# Patient Record
Sex: Female | Born: 1940 | Race: White | Hispanic: No | Marital: Married | Smoking: Never smoker
Health system: Southern US, Community
[De-identification: ages and names within clinical notes are randomized; demographics above are authoritative.]

---

## 2006-08-12 ENCOUNTER — Ambulatory Visit: Payer: Self-pay | Admitting: Family Medicine

## 2006-08-12 DIAGNOSIS — N951 Menopausal and female climacteric states: Secondary | ICD-10-CM

## 2006-08-12 DIAGNOSIS — N63 Unspecified lump in unspecified breast: Secondary | ICD-10-CM

## 2006-08-21 ENCOUNTER — Encounter: Payer: Self-pay | Admitting: Family Medicine

## 2006-08-21 LAB — CONVERTED CEMR LAB
ALT: 15 units/L (ref 0–35)
AST: 16 units/L (ref 0–37)
BUN: 16 mg/dL (ref 6–23)
Calcium: 9 mg/dL (ref 8.4–10.5)
Chloride: 108 meq/L (ref 96–112)
Cholesterol: 206 mg/dL — ABNORMAL HIGH (ref 0–200)
Creatinine, Ser: 0.7 mg/dL (ref 0.40–1.20)
Glucose, Bld: 99 mg/dL (ref 70–99)
HDL: 47 mg/dL (ref >39)
LDL Cholesterol: 126 mg/dL — ABNORMAL HIGH (ref 0–99)
Potassium: 4.3 meq/L (ref 3.5–5.3)
Sodium: 142 meq/L (ref 135–145)
Total Bilirubin: 1.2 mg/dL (ref 0.3–1.2)
Total CHOL/HDL Ratio: 4.4
Triglycerides: 166 mg/dL — ABNORMAL HIGH (ref <150)

## 2006-08-22 ENCOUNTER — Encounter: Payer: Self-pay | Admitting: Family Medicine

## 2006-08-23 ENCOUNTER — Encounter: Payer: Self-pay | Admitting: Family Medicine

## 2006-08-26 ENCOUNTER — Ambulatory Visit: Payer: Self-pay | Admitting: Family Medicine

## 2006-08-26 ENCOUNTER — Other Ambulatory Visit: Admission: RE | Admit: 2006-08-26 | Discharge: 2006-08-26 | Payer: Self-pay | Admitting: Family Medicine

## 2006-08-26 ENCOUNTER — Encounter: Payer: Self-pay | Admitting: Family Medicine

## 2006-08-29 ENCOUNTER — Telehealth (INDEPENDENT_AMBULATORY_CARE_PROVIDER_SITE_OTHER): Payer: Self-pay | Admitting: *Deleted

## 2006-10-02 ENCOUNTER — Encounter: Admission: RE | Admit: 2006-10-02 | Discharge: 2006-10-02 | Payer: Self-pay | Admitting: Family Medicine

## 2006-10-02 ENCOUNTER — Ambulatory Visit: Payer: Self-pay | Admitting: Family Medicine

## 2006-10-03 ENCOUNTER — Encounter: Payer: Self-pay | Admitting: Family Medicine

## 2007-02-01 ENCOUNTER — Encounter: Payer: Self-pay | Admitting: Family Medicine

## 2007-02-03 ENCOUNTER — Encounter (INDEPENDENT_AMBULATORY_CARE_PROVIDER_SITE_OTHER): Payer: Self-pay | Admitting: *Deleted

## 2007-12-24 ENCOUNTER — Ambulatory Visit: Payer: Self-pay | Admitting: Family Medicine

## 2008-02-16 ENCOUNTER — Ambulatory Visit: Payer: Self-pay | Admitting: Family Medicine

## 2008-02-16 ENCOUNTER — Encounter: Admission: RE | Admit: 2008-02-16 | Discharge: 2008-02-16 | Payer: Self-pay | Admitting: Family Medicine

## 2008-02-16 DIAGNOSIS — M25559 Pain in unspecified hip: Secondary | ICD-10-CM | POA: Insufficient documentation

## 2008-02-18 ENCOUNTER — Encounter: Payer: Self-pay | Admitting: Family Medicine

## 2008-02-25 ENCOUNTER — Encounter: Payer: Self-pay | Admitting: Family Medicine

## 2008-03-04 ENCOUNTER — Encounter: Payer: Self-pay | Admitting: Family Medicine

## 2008-03-08 ENCOUNTER — Encounter: Payer: Self-pay | Admitting: Family Medicine

## 2008-03-16 ENCOUNTER — Encounter: Payer: Self-pay | Admitting: Family Medicine

## 2008-03-25 ENCOUNTER — Ambulatory Visit: Payer: Self-pay | Admitting: Family Medicine

## 2008-03-25 DIAGNOSIS — S72009A Fracture of unspecified part of neck of unspecified femur, initial encounter for closed fracture: Secondary | ICD-10-CM | POA: Insufficient documentation

## 2008-03-25 DIAGNOSIS — K922 Gastrointestinal hemorrhage, unspecified: Secondary | ICD-10-CM | POA: Insufficient documentation

## 2008-03-25 LAB — CONVERTED CEMR LAB
Glucose, Urine, Semiquant: NEGATIVE
Protein, U semiquant: >=300
Specific Gravity, Urine: >=1.03

## 2008-04-16 ENCOUNTER — Telehealth: Payer: Self-pay | Admitting: Family Medicine

## 2008-05-07 ENCOUNTER — Telehealth: Payer: Self-pay | Admitting: Family Medicine

## 2008-12-17 ENCOUNTER — Ambulatory Visit: Payer: Self-pay | Admitting: Oncology

## 2009-01-31 ENCOUNTER — Ambulatory Visit: Payer: Self-pay | Admitting: Family Medicine

## 2009-01-31 DIAGNOSIS — R209 Unspecified disturbances of skin sensation: Secondary | ICD-10-CM | POA: Insufficient documentation

## 2009-01-31 DIAGNOSIS — M79609 Pain in unspecified limb: Secondary | ICD-10-CM

## 2009-02-01 ENCOUNTER — Encounter: Admission: RE | Admit: 2009-02-01 | Discharge: 2009-02-01 | Payer: Self-pay | Admitting: Family Medicine

## 2009-02-02 ENCOUNTER — Encounter: Payer: Self-pay | Admitting: Family Medicine

## 2009-02-02 LAB — CONVERTED CEMR LAB
ALT: 28 units/L (ref 0–35)
BUN: 11 mg/dL (ref 6–23)
Basophils Absolute: 0 10*3/uL (ref 0.0–0.1)
Basophils Relative: 1 % (ref 0–1)
Chloride: 105 meq/L (ref 96–112)
Cholesterol: 153 mg/dL (ref 0–200)
Creatinine, Ser: 0.61 mg/dL (ref 0.40–1.20)
Folate: 16.8 ng/mL
HCT: 43.8 % (ref 36.0–46.0)
HDL: 51 mg/dL (ref >39)
Lymphs Abs: 1.7 10*3/uL (ref 0.7–4.0)
Platelets: 247 10*3/uL (ref 150–400)
Potassium: 5.4 meq/L — ABNORMAL HIGH (ref 3.5–5.3)
RBC: 4.77 M/uL (ref 3.87–5.11)
RDW: 13.9 % (ref 11.5–15.5)
TSH: 0.297 microintl units/mL — ABNORMAL LOW (ref 0.350–4.500)
Total Bilirubin: 0.8 mg/dL (ref 0.3–1.2)
Total Protein: 7.3 g/dL (ref 6.0–8.3)
Triglycerides: 89 mg/dL (ref <150)

## 2009-02-03 LAB — CONVERTED CEMR LAB
Free T4: 1.3 ng/dL (ref 0.80–1.80)
T3, Free: 3.3 pg/mL (ref 2.3–4.2)

## 2009-05-02 ENCOUNTER — Telehealth: Payer: Self-pay | Admitting: Family Medicine

## 2009-07-29 ENCOUNTER — Ambulatory Visit: Payer: Self-pay | Admitting: Family Medicine

## 2009-07-29 ENCOUNTER — Encounter: Admission: RE | Admit: 2009-07-29 | Discharge: 2009-07-29 | Payer: Self-pay | Admitting: Family Medicine

## 2009-08-04 ENCOUNTER — Encounter: Payer: Self-pay | Admitting: Family Medicine

## 2009-08-17 ENCOUNTER — Encounter: Payer: Self-pay | Admitting: Family Medicine

## 2009-09-07 ENCOUNTER — Ambulatory Visit: Payer: Self-pay | Admitting: Family Medicine

## 2009-09-07 LAB — CONVERTED CEMR LAB
Glucose, Urine, Semiquant: NEGATIVE
Ketones, urine, test strip: NEGATIVE
Nitrite: NEGATIVE
Protein, U semiquant: 30

## 2009-09-12 ENCOUNTER — Encounter: Payer: Self-pay | Admitting: Family Medicine

## 2009-09-13 ENCOUNTER — Ambulatory Visit: Payer: Self-pay | Admitting: Family Medicine

## 2009-09-13 DIAGNOSIS — R5383 Other fatigue: Secondary | ICD-10-CM

## 2009-09-13 DIAGNOSIS — M899 Disorder of bone, unspecified: Secondary | ICD-10-CM | POA: Insufficient documentation

## 2009-09-13 DIAGNOSIS — F3289 Other specified depressive episodes: Secondary | ICD-10-CM | POA: Insufficient documentation

## 2009-09-13 DIAGNOSIS — R5381 Other malaise: Secondary | ICD-10-CM

## 2009-09-13 DIAGNOSIS — F329 Major depressive disorder, single episode, unspecified: Secondary | ICD-10-CM

## 2009-09-13 DIAGNOSIS — M949 Disorder of cartilage, unspecified: Secondary | ICD-10-CM

## 2009-09-14 DIAGNOSIS — E0590 Thyrotoxicosis, unspecified without thyrotoxic crisis or storm: Secondary | ICD-10-CM | POA: Insufficient documentation

## 2009-09-14 LAB — CONVERTED CEMR LAB
CO2: 22 meq/L (ref 19–32)
Chloride: 107 meq/L (ref 96–112)
Free T4: 1.86 ng/dL — ABNORMAL HIGH (ref 0.80–1.80)
Hemoglobin: 13.3 g/dL (ref 12.0–15.0)
Potassium: 3.7 meq/L (ref 3.5–5.3)
RBC: 4.44 M/uL (ref 3.87–5.11)
RDW: 15 % (ref 11.5–15.5)
Total Protein: 7 g/dL (ref 6.0–8.3)
Vit D, 25-Hydroxy: 33 ng/mL (ref 30–89)
WBC: 8.2 10*3/uL (ref 4.0–10.5)

## 2009-09-19 ENCOUNTER — Ambulatory Visit: Payer: Self-pay | Admitting: Family Medicine

## 2009-09-19 DIAGNOSIS — I82409 Acute embolism and thrombosis of unspecified deep veins of unspecified lower extremity: Secondary | ICD-10-CM | POA: Insufficient documentation

## 2009-09-19 DIAGNOSIS — R0989 Other specified symptoms and signs involving the circulatory and respiratory systems: Secondary | ICD-10-CM

## 2009-09-19 DIAGNOSIS — R609 Edema, unspecified: Secondary | ICD-10-CM

## 2009-09-19 DIAGNOSIS — R0609 Other forms of dyspnea: Secondary | ICD-10-CM

## 2009-09-19 LAB — CONVERTED CEMR LAB
ALT: 21 units/L (ref 0–35)
AST: 17 units/L (ref 0–37)
Alkaline Phosphatase: 126 units/L — ABNORMAL HIGH (ref 39–117)
BUN: 15 mg/dL (ref 6–23)
Basophils Relative: 0 % (ref 0–1)
Bilirubin Urine: NEGATIVE
Chloride: 106 meq/L (ref 96–112)
Creatinine, Ser: 0.63 mg/dL (ref 0.40–1.20)
Eosinophils Absolute: 0.1 10*3/uL (ref 0.0–0.7)
Glucose, Urine, Semiquant: NEGATIVE
HCT: 41.3 % (ref 36.0–46.0)
Hemoglobin: 13.6 g/dL (ref 12.0–15.0)
Ketones, urine, test strip: NEGATIVE
Lymphocytes Relative: 21 % (ref 12–46)
Lymphs Abs: 1.5 10*3/uL (ref 0.7–4.0)
MCHC: 32.9 g/dL (ref 30.0–36.0)
Monocytes Absolute: 0.4 10*3/uL (ref 0.1–1.0)
Neutro Abs: 4.9 10*3/uL (ref 1.7–7.7)
Neutrophils Relative %: 71 % (ref 43–77)
Potassium: 3.7 meq/L (ref 3.5–5.3)
RDW: 14.9 % (ref 11.5–15.5)
Specific Gravity, Urine: 1.02
Total Bilirubin: 0.8 mg/dL (ref 0.3–1.2)
WBC: 6.9 10*3/uL (ref 4.0–10.5)
pH: 6.5

## 2009-09-21 ENCOUNTER — Encounter: Payer: Self-pay | Admitting: Family Medicine

## 2009-09-22 ENCOUNTER — Telehealth (INDEPENDENT_AMBULATORY_CARE_PROVIDER_SITE_OTHER): Payer: Self-pay | Admitting: *Deleted

## 2009-09-23 ENCOUNTER — Encounter: Payer: Self-pay | Admitting: Family Medicine

## 2009-09-23 LAB — CONVERTED CEMR LAB
Prothrombin Time: 18.8 s
Prothrombin Time: 18.8 s — ABNORMAL HIGH (ref 11.6–15.2)

## 2009-09-28 ENCOUNTER — Encounter: Payer: Self-pay | Admitting: Family Medicine

## 2009-09-28 LAB — CONVERTED CEMR LAB: Prothrombin Time: 24.4 s

## 2009-10-03 ENCOUNTER — Ambulatory Visit: Payer: Self-pay | Admitting: Family Medicine

## 2009-10-04 LAB — CONVERTED CEMR LAB
BUN: 15 mg/dL (ref 6–23)
Calcium: 9.8 mg/dL (ref 8.4–10.5)
Glucose, Bld: 99 mg/dL (ref 70–99)
Potassium: 3.9 meq/L (ref 3.5–5.3)

## 2009-10-26 ENCOUNTER — Ambulatory Visit: Payer: Self-pay | Admitting: Family Medicine

## 2009-10-26 LAB — CONVERTED CEMR LAB: INR: 1.6

## 2009-12-02 ENCOUNTER — Ambulatory Visit: Payer: Self-pay | Admitting: Family Medicine

## 2009-12-16 ENCOUNTER — Ambulatory Visit: Payer: Self-pay | Admitting: Family Medicine

## 2009-12-16 DIAGNOSIS — L84 Corns and callosities: Secondary | ICD-10-CM

## 2009-12-22 ENCOUNTER — Encounter: Payer: Self-pay | Admitting: Family Medicine

## 2009-12-23 ENCOUNTER — Ambulatory Visit: Payer: Self-pay | Admitting: Family Medicine

## 2009-12-23 LAB — CONVERTED CEMR LAB: INR: 2

## 2010-01-20 ENCOUNTER — Ambulatory Visit: Payer: Self-pay | Admitting: Family Medicine

## 2010-02-24 ENCOUNTER — Ambulatory Visit
Admission: RE | Admit: 2010-02-24 | Discharge: 2010-02-24 | Payer: Self-pay | Source: Home / Self Care | Attending: Family Medicine | Admitting: Family Medicine

## 2010-02-24 LAB — CONVERTED CEMR LAB: INR: 2.2

## 2010-03-05 ENCOUNTER — Encounter: Payer: Self-pay | Admitting: Family Medicine

## 2010-03-05 ENCOUNTER — Encounter: Payer: Self-pay | Admitting: Sports Medicine

## 2010-03-15 NOTE — Letter (Signed)
Summary: [facility]  [facility]   Imported By: Lanelle Bal 09/22/2009 12:37:38  _____________________________________________________________________  External Attachment:    Type:   Image     Comment:   External Document

## 2010-03-15 NOTE — Assessment & Plan Note (Signed)
Summary: weakness   Vital Signs:  Patient profile:   70 year old female Height:      62 inches Weight:      151 pounds BMI:     27.72 O2 Sat:      99 % on Room air Pulse rate:   89 / minute BP sitting:   147 / 78  (left arm) Cuff size:   regular  Vitals Entered By: Payton Spark CMA (December 16, 2009 1:19 PM)  O2 Flow:  Room air CC: Anxious, shakey, and feet burning/hot.   Primary Care Provider:  Seymour Bars D.O.  CC:  Anxious, shakey, and and feet burning/hot..  History of Present Illness: 70 yo WF presents for problems feeling shakey with anxiety and 6 mos of her feet burning with pain over the plantar surface of the R foot where calluses are and feeling a little lightheaded x 4 days but no true vertigo.  On Coumadin for DVT thru Jan 1st.  She has not had bruising, bleeding problems.  Denies diarrhea or vomitting.  Does not drink much in the way of fluids.  Denies urinary complaints or abd pain.  She had had weakness in her legs for a while and we set her up to see neuro for this in the summer but she did not keep her appt.  She has not had L spine MRI or brain MRI b/c she has metal in her hip and was afraid to do anything.  She took herself off Citalopram earlier this year simply b/c she didn't want to take it.  She has also been on neurontin and a TCA but had poor compliance.  Anticoagulation Management History:      She is being anticoagulated because of the first episode of deep venous thrombosis and/or pulmonary embolism.  Anticipated length of treatment is 3-6 months.  Her last INR was 1.8.    Current Medications (verified): 1)  Viactiv   Chew (Calcium-Vitamin D-Vitamin K Chew) .... Take 1 Tablet By Mouth Once A Day 2)  Tylenol Extra Strength 500 Mg Tabs (Acetaminophen) .... 2 Tabs By Mouth Three Times A Day As Needed Pain 3)  Omeprazole 40 Mg Cpdr (Omeprazole) .Marland Kitchen.. 1 Tab By Mouth Daily, Take 30 Min Before Breakfast 4)  Vesicare 5 Mg Tabs (Solifenacin Succinate) 5)   Coumadin 5 Mg Tabs (Warfarin Sodium) .... Sunday - 5 Mg, Monday - 5 Mg, Tuesday - 5 Mg, Wednesday - 5 Mg, Thursday - 5 Mg, Friday - 2.5 Mg, Saturday - 5 Mg  Allergies (verified): 1)  Vicodin  Past History:  Past Medical History: Reviewed history from 10/03/2009 and no changes required. G4P4 menopausal since age 89. 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 cardiac cath 1998- normal pinning of L hip fracture 02-2008  Family History: Reviewed history from 08/12/2006 and no changes required. mother colon cancer, died at 31 father died at 35, AMI in 73's, valvular dz sister alive and healthy  Social History: Reviewed history from 08/12/2006 and no changes required. Married to [place], retired.  Moved from [place] in 2004. 3 kids in [place], 1 in [place]. 3 grandkids.  Never smoked.  Denies ETOH. Gardens and walks 3 x a wk.  Fair diet.    Review of Systems      See HPI  Physical Exam  General:  alert, well-developed, well-nourished, well-hydrated, and overweight-appearing.  here with husband Head:  normocephalic and atraumatic.   Eyes:  sclera non icteric Nose:  no nasal discharge.   Mouth:  pharynx pink and moist.   Neck:  no masses.   Lungs:  Normal respiratory effort, chest expands symmetrically. Lungs are clear to auscultation, no crackles or wheezes. Heart:  Normal rate and regular rhythm. S1 and S2 normal without gallop, murmur, click, rub or other extra sounds. Pulses:  2+ pedal pulses Extremities:  trace pedal edema bilat with telangiecstasias over both ankles.  hammertoes.   Neurologic:  amulating with cane Skin:  color normal.  no pallor calluses - plantar surface R foot vs plantar warts and a callus over the L great toe Cervical Nodes:  No lymphadenopathy noted Psych:  depressed affect.     Impression & Recommendations:  Problem # 1:   CALLUS, FOOT (ICD-700) Assessment New Either corns or plantar warts, tender under the R foot. Send to Dr Yates Decamp for eval and tx. Orders: Podiatry Referral (Podiatry)  Problem # 2:  WEAKNESS (ICD-780.79) Ongoing problem with continued complaints along with this ? tremor and ? neuropathy. She failed to keep her neuro appt in August.  She is to schedule her own appt this time and I explained to her husband that she likely has lumbar stenosis or neuropathy and needs to see the neurologist.    Complete Medication List: 1)  Viactiv Chew (Calcium-vitamin d-vitamin k chew) .... Take 1 tablet by mouth once a day 2)  Tylenol Extra Strength 500 Mg Tabs (Acetaminophen) .... 2 tabs by mouth three times a day as needed pain 3)  Omeprazole 40 Mg Cpdr (Omeprazole) .Marland Kitchen.. 1 tab by mouth daily, take 30 min before breakfast 4)  Vesicare 5 Mg Tabs (Solifenacin succinate) 5)  Coumadin 5 Mg Tabs (Warfarin sodium) .... Sunday - 5 mg, monday - 5 mg, tuesday - 5 mg, wednesday - 5 mg, thursday - 5 mg, friday - 2.5 mg, saturday - 5 mg 6)  Citalopram Hydrobromide 20 Mg Tabs (Citalopram hydrobromide) .... 1/2 tab by mouth daily x 1 wk then increase to 1 tab by mouth daily 7)  Coumadin 5 Mg Tabs (Warfarin sodium) .... 1 tab by mouth daily  Patient Instructions: 1)  Restart Citalopram for anxiety.  Take this everyday. 2)  Will get you in with Dr Sprinkle for your feet and Dr Pinyon for leg weakness, ? neuropathy and tremor. 3)  REturn for f/u in 2 mos. Prescriptions: COUMADIN 5 MG TABS (WARFARIN SODIUM) 1 tab by mouth daily  #30 x 1   Entered and Authorized by:   Karen Bowen DO   Signed by:   Karen Bowen DO on 12/16/2009   Method used:   [facility]  [address] [id number]       [address]       [place], [place]  [address]       Ph: [phone] or [phone]       Fax: [phone]   RxID:   [id number] CITALOPRAM HYDROBROMIDE 20 MG TABS (CITALOPRAM HYDROBROMIDE) 1/2 tab by mouth  daily x 1 wk then increase to 1 tab by mouth daily  #30 x 2   Entered and Authorized by:   Seymour Bars DO   Signed by:   Seymour Bars DO on 12/16/2009   Method used:   [facility]        [facility] [phone][facility] (retail)  [address]       [place], [place]  [address]       Ph: [phone] or [phone]       Fax: [phone]   RxID:   [id number]    Orders Added: 1)  Podiatry Referral [Podiatry] 2)  Est. Patient Level IV [[address]253]

## 2010-03-15 NOTE — Assessment & Plan Note (Signed)
Summary: coumdin check - jr  Nurse Visit   Vitals Entered By: Payton Spark CMA (December 23, 2009 1:27 PM)  Allergies: 1)  Vicodin Laboratory Results   Blood Tests      INR: 2.0   (Normal Range: 0.88-1.12   Therap INR: 2.0-3.5)    Orders Added: 1)  Fingerstick [36416] 2)  Protime [[address]UE]   Anticoagulation Management History:      The patient is on coumadin and comes in today for a routine follow up visit.  Coumadin therapy is being given due to the first episode of deep venous thrombosis and/or pulmonary embolism.  Anticipated length of treatment is 3-6 months.  Her last INR was 1.8 and today's INR is 2.0.    Anticoagulation Management Assessment/Plan:      The target INR is 2.0-3.0.  She is to have a PT/INR in 4 weeks.  Anticoagulation instructions were given to patient.         Current Anticoagulation Instructions: The patient is to continue with the same dose of coumadin.  This dosage includes:  Coumadin 5 mg tabs and Coumadin 5 mg tabs:  Sunday - 5 mg, Monday - 5 mg, Tuesday - 5 mg, Wednesday - 5 mg, Thursday - 5 mg, Friday - 2.5 mg, Saturday - 5 mg.    Repeat PT/INR in 4 weeks.

## 2010-03-15 NOTE — Assessment & Plan Note (Signed)
Summary: UA  Nurse Visit   Vitals Entered By: Payton Spark CMA (September 07, 2009 2:34 PM)  Allergies: 1)  Vicodin Laboratory Results   Urine Tests    Routine Urinalysis   Color: yellow Appearance: Clear Glucose: negative   (Normal Range: Negative) Bilirubin: small   (Normal Range: Negative) Ketone: negative   (Normal Range: Negative) Spec. Gravity: >=1.030   (Normal Range: 1.003-1.035) Blood: large   (Normal Range: Negative) pH: 5.0   (Normal Range: 5.0-8.0) Protein: 30   (Normal Range: Negative) Urobilinogen: 0.2   (Normal Range: 0-1) Nitrite: negative   (Normal Range: Negative) Leukocyte Esterace: negative   (Normal Range: Negative)    Comments: Pt states she noticed blood in urine on Monday. She has started w/ nausea and dysuria today.     Orders Added: 1)  UA Dipstick w/o Micro (automated)  [81003] 2)  Est. Patient Level I [04540] Prescriptions: ROLLING WALKER Use as directed  #1 x 0   Entered and Authorized by:   Seymour Bars DO   Signed by:   Seymour Bars DO on 09/07/2009   Method used:   Printed then faxed to ...       [facility]  [address] [phone]* (retail)       [address] [place], [place]  [address]       Ph: [phone] or [phone]       Fax: [phone]   RxID:   [id number] LEVAQUIN 250 MG TABS (LEVOFLOXACIN) 1 tab by mouth once a day x 3 days  #3 tabs x 0   Entered and Authorized by:   Seymour Bars DO   Signed by:   Seymour Bars DO on 09/07/2009   Method used:   [facility]        [facility] [phone]* (retail)       [address] [place], [place]  [address]       Ph: [phone] or [phone]       Fax: [phone]   RxID:   [id number]      Impression & Recommendations:  Problem # 1:  UTI (ICD-599.0) UA + for infection with symptoms.  Will treat with 3 days of Levaquin.  Call if symptoms not resolved by Friday. Her updated medication list for this problem includes:    Vesicare 5 Mg Tabs (Solifenacin  succinate) .Marland Kitchen... 1 tab by mouth daily    Levaquin 250 Mg Tabs (Levofloxacin) .Marland Kitchen... 1 tab by mouth once a day x 3 days  Orders: UA Dipstick w/o Micro (automated)  (81003)  Complete Medication List: 1)  Viactiv Chew (Calcium-vitamin d-vitamin k chew) .... Take 1 tablet by mouth once a day 2)  Vesicare 5 Mg Tabs (Solifenacin succinate) .Marland Kitchen.. 1 tab by mouth daily 3)  Prednisone (pak) 10 Mg Tabs (Prednisone) .... Take the 12 day pack as directed 4)  Cyclobenzaprine Hcl 10 Mg Tabs (Cyclobenzaprine hcl) .Marland Kitchen.. 1 by mouth 2 times daily as needed for back pain- will cause drowsiness 5)  Levaquin 250 Mg Tabs (Levofloxacin) .Marland Kitchen.. 1 tab by mouth once a day x 3 days 6)  Rolling Walker  .... Use as directed  Appended Document: UA    Patient Instructions: 1)  Take 3 days of Levaquin for UTI. 2)  I will talk to your orthopedist about your leg pain and Dr Margaretha Sheffield. 3)  RX for Rolling walker given. 4)  If UTI symptoms not  improved in 3 days, pls call.   Appended Document: UA Pt aware of the above

## 2010-03-15 NOTE — Assessment & Plan Note (Signed)
Summary: LEG PROBLEMS//VGJ   Vital Signs:  Patient profile:   70 year old female Height:      62 inches Pulse rate:   86 / minute BP sitting:   140 / 81  (left arm) Cuff size:   regular  Vitals Entered By: Payton Spark CMA (July 29, 2009 1:25 PM) CC: Low back pain and leg pain x 1 week. Getting worse.   History of Present Illness: 70 yo woman here today for back and leg pain.  sxs first started 1 week ago while doing her home exercise program (stretches)- felt a pulling pain.  pain has been progressive w/ yesterday and today being the worst.  will have 'a spasm' and then it goes away.  pt w/ hx of femur fx and metal plate.  pain is L sided, and radiating down into lower leg and foot.  pt c/o radiation of numbness and tingling rather than pain.  pain w/ rotation to the R, pain w/ standing.  pain improves w/ sitting.  has taken Aleve and Advil PM w/out relief.  has seen [place] [facility] in past- would prefer not to see them again.    Current Medications (verified): 1)  Viactiv   Chew (Calcium-Vitamin D-Vitamin K Chew) .... Take 1 Tablet By Mouth Once A Day 2)  Vesicare 5 Mg Tabs (Solifenacin Succinate) .Marland Kitchen.. 1 Tab By Mouth Daily  Allergies (verified): 1)  Vicodin  Past History:  Past Surgical History: Last updated: 03/25/2008 cardiac cath 1998- normal pinning of L hip fracture 02-2008  Review of Systems      See HPI  Physical Exam  General:  alert, well-developed, well-nourished, and well-hydrated.  obese WF.  sitting in wheelchair Msk:  no TTP over lumbar spine or L hip.  (-) SLR bilaterally.  good ROM of L hip. limited flexion and extension of back Pulses:  2+ pedal pulses Extremities:  +1 edema of L ankle Neurologic:  diminished patellar reflexes abnormal gait strength 4-5/5 bilaterally   Impression & Recommendations:  Problem # 1:  HIP PAIN, LEFT (ICD-719.45) Assessment Unchanged limited exam due to pt's fear of pain and unwillingness to participate.  given radicular sxs  will start steroid taper.  muscle relaxer as needed for muscle spasm.  no narcotics due to vicodin allergy.  no bowel or bladder incontinence at this time.  check xrays of hip to assess plate placement and eval for occult fx.  refer to ortho.  reviewed supportive care and red flags that should prompt return.  Pt expresses understanding and is in agreement w/ this plan. Her updated medication list for this problem includes:    Cyclobenzaprine Hcl 10 Mg Tabs (Cyclobenzaprine hcl) .Marland Kitchen... 1 by mouth 2 times daily as needed for back pain- will cause drowsiness  Orders: T-Hip Comp Left Min 2-views (73510TC) Orthopedic Referral (Ortho) Prescription Created Electronically [phone])  Complete Medication List: 1)  Viactiv Chew (Calcium-vitamin d-vitamin k chew) .... Take 1 tablet by mouth once a day 2)  Vesicare 5 Mg Tabs (Solifenacin succinate) .Marland Kitchen.. 1 tab by mouth daily 3)  Prednisone (pak) 10 Mg Tabs (Prednisone) .... Take the 12 day pack as directed 4)  Cyclobenzaprine Hcl 10 Mg Tabs (Cyclobenzaprine hcl) .Marland Kitchen.. 1 by mouth 2 times daily as needed for back pain- will cause drowsiness  Patient Instructions: 1)  We'll call you with your xray results 2)  Someone will call you with your ortho appt 3)  Alternate heat and ice for pain 4)  Start the Prednisone as  directed 5)  Use the cyclobenzaprine (muscle relaxer) at night- will cause drowsiness 6)  Call with any questions or concerns Prescriptions: VESICARE 5 MG TABS (SOLIFENACIN SUCCINATE) 1 tab by mouth daily  #90 x 1   Entered by:   Payton Spark CMA   Authorized by:   Seymour Bars DO   Signed by:   Payton Spark CMA on 07/29/2009   Method used:   Faxed to ...       [facility]* (mail-order)       [address]       [place], [place]  [address]       Ph: [phone]       Fax: [phone]   RxID:   [id number] CYCLOBENZAPRINE HCL 10 MG  TABS (CYCLOBENZAPRINE HCL) 1 by mouth 2 times daily as needed for back pain- will cause  drowsiness  #30 x 0   Entered and Authorized by:   Neena Rhymes MD   Signed by:   Neena Rhymes MD on 07/29/2009   Method used:   [facility]        [facility] [phone]ail[facility]       [address]       [place], [place]  [address]       Ph: [phone] or [phone]       Fax: [phone]   RxID:   [id number] PREDNISONE (PAK) 10 MG TABS (PREDNISONE) take the 12 day pack as directed  #1 pack x 0   Entered and Authorized by:   Neena Rhymes MD   Signed by:   Neena Rhymes MD on 07/29/2009   Method used:   [facility]        [facility] [phone][facility] (retail)       [address]       [place], [place]  [address]       Ph: [phone] or [phone]       Fax: [phone]   RxID:   [id number]

## 2010-03-15 NOTE — Progress Notes (Signed)
Summary: Mail order denied Detrol [place]  Phone Note Call from Patient   Caller: Patient Summary of Call: Pt LMOM stating mail order company will not cover Detrol [place]. Pt needs 90 day supply of enablex, vesicare or oxybutynin. Whichever you feel is most appropriate for Pt. Please advise. Initial call taken by: Payton Spark CMA,  May 02, 2009 2:09 PM    New/Updated Medications: VESICARE 5 MG TABS (SOLIFENACIN SUCCINATE) 1 tab by mouth daily Prescriptions: VESICARE 5 MG TABS (SOLIFENACIN SUCCINATE) 1 tab by mouth daily  #90 x 1   Entered and Authorized by:   Seymour Bars DO   Signed by:   Seymour Bars DO on 05/02/2009   Method used:   [facility]        [facility] [phone]* (retail)       [address]       [place], [place]  [address]       Ph: [phone] or [phone]       Fax: [phone]   RxID:   [id number]   Appended Document: Mail order denied Detrol [place] Faxed

## 2010-03-15 NOTE — Assessment & Plan Note (Signed)
Summary: 4 week coumadin check - jr  Nurse Visit   Vitals Entered By: Payton Spark CMA (January 20, 2010 1:25 PM)  Anticoagulation Management History:      The patient is on coumadin and comes in today for a routine follow up visit.  Coumadin therapy is being given due to the first episode of deep venous thrombosis and/or pulmonary embolism.  Anticipated length of treatment is 3-6 months.  Her last INR was 2.0 and today's INR is 2.5.     Anticoagulation Management Assessment/Plan:      The target INR is 2.0-3.0.  She is to have a PT/INR in 4 weeks.  Anticoagulation instructions were given to patient.         Current Anticoagulation Instructions: The patient is to continue with the same dose of coumadin.  This dosage includes:   Coumadin 5 mg tabs and Coumadin 5 mg tabs:  Sunday - 5 mg, Monday - 5 mg, Tuesday - 5 mg, Wednesday - 5 mg, Thursday - 5 mg, Friday - 2.5 mg, Saturday - 5 mg.    Repeat PT/INR in 4 weeks.     Allergies: 1)  Vicodin Laboratory Results   Blood Tests      INR: 2.5   (Normal Range: 0.88-1.12   Therap INR: 2.0-3.5)    Orders Added: 1)  Fingerstick [36416] 2)  Protime [[address]UE]   Anticoagulation Management Assessment/Plan:      The target INR is 2.0-3.0.  She is to have a PT/INR in 4 weeks.  Anticoagulation instructions were given to patient.         Current Anticoagulation Instructions: The patient is to continue with the same dose of coumadin.  This dosage includes:   Coumadin 5 mg tabs and Coumadin 5 mg tabs:  Sunday - 5 mg, Monday - 5 mg, Tuesday - 5 mg, Wednesday - 5 mg, Thursday - 5 mg, Friday - 2.5 mg, Saturday - 5 mg.    Repeat PT/INR in 4 weeks.

## 2010-03-15 NOTE — Assessment & Plan Note (Signed)
Summary: weakness   Vital Signs:  Patient profile:   70 year old female Height:      62 inches O2 Sat:      96 % on Room air Pulse rate:   97 / minute BP sitting:   126 / 79  (left arm) Cuff size:   regular  Vitals Entered By: Payton Spark CMA (September 13, 2009 3:03 PM)  O2 Flow:  Room air CC: F/U. Has multiple compliants about back, legs and bladder.   Primary Care Provider:  Seymour Bars D.O.  CC:  F/U. Has multiple compliants about back and legs and bladder.Marland Kitchen  History of Present Illness: 70 yo WF presents for many problems.  1.  She has a pin in her L hip, placed Jan 2010.  She had a slow recovery after that, graduating to a walker most of the time.  Without any known cause, she started having increased pain in the L hip in June.  She was seen here followed by a visit to Dr Margaretha Sheffield and Dr Elesa Massed (ortho).  It was felt that here hardware was stable.  She recieved a bursae injection which did not help.  She took herself off all of her pain meds.  She is in PT at [facility] and using a walker and a wheelchair now.    2.  She saw me back in Dec for bilat leg weakness and at that time we discussed a possible dx of Lumbar stenosis.  She has gotten worse as far as weakness with ambulating in both legs.  She denies much numbness or LBP.  She has had OAB symptoms and fatigue of her UEs.  Her husband denies that she has trouble speaking, mentating or remembering things.  She has been depressed since all of this has occured.     Current Medications (verified): 1)  Viactiv   Chew (Calcium-Vitamin D-Vitamin K Chew) .... Take 1 Tablet By Mouth Once A Day  Allergies (verified): 1)  Vicodin  Past History:  Past Medical History: Reviewed history from 03/25/2008 and no changes required. G4P4 menopausal since age 68. hx of melanoma hx of benign breast mass 2004 upper GI bleed 03-2008  Past Surgical History: Reviewed history from 03/25/2008 and no changes required. cardiac cath 1998-  normal pinning of L hip fracture 02-2008  Social History: Reviewed history from 08/12/2006 and no changes required. Married to [place], retired.  Moved from [place] in 2004. 3 kids in [place], 1 in [place]. 3 grandkids.  Never smoked.  Denies ETOH. Gardens and walks 3 x a wk.  Fair diet.    Review of Systems General:  Complains of fatigue, sleep disorder, and weakness; denies loss of appetite. CV:  Denies shortness of breath with exertion and swelling of feet. GU:  Complains of incontinence and urinary frequency. MS:  Complains of joint pain and muscle weakness; denies low back pain. Neuro:  Complains of numbness and poor balance; denies falling down, headaches, inability to speak, memory loss, tingling, and tremors. Psych:  Complains of easily tearful.  Physical Exam  General:  overwt WF here with husband, sitting in wheelchair Head:  normocephalic and atraumatic.   Eyes:  pupils equal, pupils round, and pupils reactive to light.   Mouth:  pharynx pink and moist.   Neck:  supple and full ROM.   Lungs:  Normal respiratory effort, chest expands symmetrically. Lungs are clear to auscultation, no crackles or wheezes. Heart:  Normal rate and regular rhythm. S1 and S2 normal  without gallop, murmur, click, rub or other extra sounds. Msk:  able to actively flex the R hip better than the L.  Able to extend both legs with strenghth R>Ll  full c spine and UE ROM.  L spine NTTP.  slow gait with weaknes on R>L foot dorsiflexion.   Extremities:  no LE edema Neurologic:  sensation intact to light touch.   Skin:  color normal.   Cervical Nodes:  No lymphadenopathy noted Psych:  memory intact for recent and remote, good eye contact, and tearful.     Impression & Recommendations:  Problem # 1:  HIP PAIN, LEFT (ZOX-096.04) Reviewed notes from Dr Elesa Massed and Dr Margaretha Sheffield.  It seems that her hip pain is only part of the problem.  She is doing PT.  Failed to improve after June's steroid injection.  She was  told that she may need to have plate and screws removed and will need to f/u with Dr Elesa Massed.  She has decided to avoid RX pain meds, so I have given her the proper amt of tylenol she can take.  Avoiding NSAIDs after having an upper GI bleed last year. The following medications were removed from the medication list:    Cyclobenzaprine Hcl 10 Mg Tabs (Cyclobenzaprine hcl) .Marland Kitchen... 1 by mouth 2 times daily as needed for back pain- will cause drowsiness Her updated medication list for this problem includes:    Tylenol Extra Strength 500 Mg Tabs (Acetaminophen) .Marland Kitchen... 2 tabs by mouth three times a day as needed pain  Problem # 2:  WEAKNESS (ICD-780.79) Unchanged from our visit in Dec.  She likely has lumbar stenosis but has metal in her hip, so unable to get an MRI.  She has significant weakness in her gait in both legs today and I am concerned that she may have a central neurologic process.  She and her husband deny problems with mentation  and she had a normal B12 and Folic Acid 6 mos ago.  She had some micrographia and slow gait.  Parkinsons Dz and MS are in the DDX.  If everything is normal, my next step would be a neurology referral.   Orders: T-CBC No Diff ([id number]) T-Comprehensive Metabolic Panel ([id number])  Problem # 3:  OSTEOPENIA (ICD-733.90) Will update her DEXA and Vit D level with thyroid function.  She does NOT need to see and endocrinologist for this. Her updated medication list for this problem includes:    Viactiv Chew (Calcium-vitamin d-vitamin k chew) .Marland Kitchen... Take 1 tablet by mouth once a day  Orders: T-DXA Bone Density/ Appendicular (86578) T-Dual DXA Bone Density/ Axial (46962) T-Vitamin D (25-Hydroxy) (95284-13244)  Problem # 4:  HYPERTHYROIDISM, SUBCLINICAL (ICD-242.90) Recheck labs today. Orders: T-TSH [id number]) T-T3, Free [id number]) T-T4, Free [phone])  Problem # 5:  DEPRESSION, MILD (ICD-311) PHQ-9 score of 6 (though she seems more tearful than this  during our history). A big part of this has been chronic pain and loss of function.  Will treat her nerve root pain and mood with amitriptyline at bedtime. RTC in 1 month. Her updated medication list for this problem includes:    Amitriptyline Hcl 25 Mg Tabs (Amitriptyline hcl) .Marland Kitchen... 1/2 tab by mouth qhs  Complete Medication List: 1)  Viactiv Chew (Calcium-vitamin d-vitamin k chew) .... Take 1 tablet by mouth once a day 2)  Amitriptyline Hcl 25 Mg Tabs (Amitriptyline hcl) .... 1/2 tab by mouth qhs 3)  Tylenol Extra Strength 500 Mg Tabs (Acetaminophen) .... 2 tabs by mouth three  times a day as needed pain  Patient Instructions: 1)  Labs today. 2)  Will call you w/ results tomorrow. 3)  Set up DEXA scan. 4)  Will contact ortho/ sports med re: L spine imaging. 5)  Return for f/u weakness in 1 month. Prescriptions: AMITRIPTYLINE HCL 25 MG TABS (AMITRIPTYLINE HCL) 1/2 tab by mouth qhs  #30 x 2   Entered and Authorized by:   Seymour Bars DO   Signed by:   Seymour Bars DO on 09/13/2009   Method used:   Electronically to        [facility] [phone][facility] (retail)       [address]       [place], [place]  [address]       Ph: [phone] or [phone]       Fax: [phone]   RxID:   [id number]

## 2010-03-15 NOTE — Consult Note (Signed)
Summary: [facility]  [facility]   Imported By: Lanelle Bal 08/17/2009 13:46:44  _____________________________________________________________________  External Attachment:    Type:   Image     Comment:   External Document

## 2010-03-15 NOTE — Progress Notes (Signed)
Summary: INR order for lab  Phone Note Call from Patient   Caller: Spouse Summary of Call: Pt has to have PT/INR done at the lab per insurance. I will send order and Pt will have drawn tomorrow  Initial call taken by: Payton Spark CMA,  September 22, 2009 1:29 PM  New Problems: COUMADIN THERAPY (ICD-V58.61) AC VENUS EMBO & THROMB UNSPEC DEEP VES LOWER EXT (ICD-453.40)   New Problems: COUMADIN THERAPY (ICD-V58.61) AC VENUS EMBO & THROMB UNSPEC DEEP VES LOWER EXT (ICD-453.40)

## 2010-03-15 NOTE — Assessment & Plan Note (Signed)
Summary: INR check & flu shot - jr  Nurse Visit  Flu Vaccine Consent Questions     Do you have a history of severe allergic reactions to this vaccine? no    Any prior history of allergic reactions to egg and/or gelatin? no    Do you have a sensitivity to the preservative Thimersol? no    Do you have a past history of Guillan-Barre Syndrome? no    Do you currently have an acute febrile illness? no    Have you ever had a severe reaction to latex? no    Vaccine information given and explained to patient? yes    Are you currently pregnant? no    Lot Number:[id number]   Exp Date:08/12/2010   Site Given  Left Deltoid IM   Allergies: 1)  Vicodin Laboratory Results   Blood Tests   Date/Time Received: 12/02/2009 Date/Time Reported: 12/02/2009   INR: 1.8   (Normal Range: 0.88-1.12   Therap INR: 2.0-3.5)    Orders Added: 1)  Fingerstick [36416] 2)  Protime [85610QW] 3)  Flu Vaccine 64yrs + MEDICARE PATIENTS [Q2039] 4)  Administration Flu vaccine - MCR [G0008]   Anticoagulation Management History:      The patient is on coumadin and comes in today for a routine follow up visit.  She is being anticoagulated because of the first episode of deep venous thrombosis and/or pulmonary embolism.  Anticipated length of treatment is 3-6 months.  Her last INR was 1.6 and today's INR is 1.8.    Anticoagulation Management Assessment/Plan:      The target INR is 2.0-3.0.  She is to have a PT/INR in 3 weeks.         Current Anticoagulation Instructions: The patient's dosage of coumadin will be increased.  The new dosage includes:   Coumadin 5 mg tabs:  Sunday - 5 mg, Monday - 5 mg, Tuesday - 5 mg, Wednesday - 5 mg, Thursday - 5 mg, Friday - 2.5 mg, Saturday - 5 mg.    Repeat PT/INR in 3 weeks.

## 2010-03-15 NOTE — Assessment & Plan Note (Signed)
Summary: leg edema   Vital Signs:  Patient profile:   70 year old female Height:      62 inches O2 Sat:      97 % on Room air Temp:     98.4 degrees F oral Pulse rate:   92 / minute BP sitting:   133 / 80  (left arm) Cuff size:   regular  Vitals Entered By: Payton Spark CMA (September 19, 2009 10:48 AM)  O2 Flow:  Room air CC: Feet swollen x 2 days.   Primary Care Provider:  Seymour Bars D.O.  CC:  Feet swollen x 2 days.Andrea Munoz  History of Present Illness: 70 yo WF presents for new onset bilateral foot swelling x 2 days.    She has had swelling before but it was with her hip fracture.  She has not had any recent trauma. She does not seem to be retaining fluid elsewhere.  Denies any SOB but has also been very sedentary.  Denies any PND or orthopnea.  She tried ice and elevation but the swelling did not improve.  She also has not had much improvment with early morning or with elevation.  Denies any changes to her meds or problems voiding.    I saw her recently for her leg weaknss and hip pain.     Current Medications (verified): 1)  Viactiv   Chew (Calcium-Vitamin D-Vitamin K Chew) .... Take 1 Tablet By Mouth Once A Day 2)  Amitriptyline Hcl 25 Mg Tabs (Amitriptyline Hcl) .... 1/2 Tab By Mouth Qhs 3)  Tylenol Extra Strength 500 Mg Tabs (Acetaminophen) .... 2 Tabs By Mouth Three Times A Day As Needed Pain 4)  Omeprazole 40 Mg Cpdr (Omeprazole) .Andrea Munoz.. 1 Tab By Mouth Daily, Take 30 Min Before Breakfast  Allergies (verified): 1)  Vicodin  Past History:  Past Medical History: Reviewed history from 03/25/2008 and no changes required. G4P4 menopausal since age 20. hx of melanoma hx of benign breast mass 2004 upper GI bleed 03-2008  Past Surgical History: Reviewed history from 03/25/2008 and no changes required. cardiac cath 1998- normal pinning of L hip fracture 02-2008  Social History: Reviewed history from 08/12/2006 and no changes required. Married to [place], retired.  Moved  from [place] in 2004. 3 kids in [place], 1 in [place]. 3 grandkids.  Never smoked.  Denies ETOH. [place] and walks 3 x a wk.  Fair diet.    Review of Systems General:  Complains of fatigue and weakness; denies chills, fever, malaise, sleep disorder, and weight loss. Eyes:  Denies blurring. CV:  Complains of swelling of feet; denies chest pain or discomfort and swelling of hands. GI:  Denies abdominal pain and bloody stools. MS:  leg/ foot pain. Neuro:  Complains of poor balance.  Physical Exam  General:  overwt WF here with husband, sitting in wheelchair Head:  normocephalic and atraumatic.   Eyes:  sclera non icteric Mouth:  pharynx pink and moist.   Neck:  no masses.   Lungs:  Normal respiratory effort, chest expands symmetrically. Lungs are clear to auscultation, no crackles or wheezes. Heart:  Normal rate and regular rhythm. S1 and S2 normal without gallop, murmur, click, rub or other extra sounds. Abdomen:  soft and non-tender.   Extremities:  bilateral 3+ pitting pedal edema with 1+ pitting edema all the way up to the knees.  No redness or heat.   Skin:  color normal.   Cervical Nodes:  No lymphadenopathy noted Inguinal Nodes:  No significant  adenopathy Psych:  flat affect.     Impression & Recommendations:  Problem # 1:  LEG EDEMA, BILATERAL (ICD-782.3) Assessment New UA neg for proteinuria.  Unable to weigh since she is so unsteady on her feet.  Will get labs today to look for cause of sudden bilat foot swelling and f/u results this afternoon.  R/O CHF, low serum protein, blood clots, etc.  Will have her elevate her legs at home and use Furosmide (assuming normal renal function) for her swelling.   Her updated medication list for this problem includes:    Furosemide 40 Mg Tabs (Furosemide) .Andrea Munoz... 1 tab by mouth daily for leg swelling  Orders: T-Comprehensive Metabolic Panel [phone]) T-BNP  (B Natriuretic Peptide) ([id number]) T-D-Dimer Fibrin Derivatives Quantitive  [phone]) T-CBC w/Diff ([id number]) T-LDH (10272-53664) UA Dipstick w/o Micro (automated)  (81003)  Problem # 2:  HYPERTHYROIDISM (ICD-242.90) Assessment: New This is new.  It may be a reason for her leg swelling and we do need to proceed with nuclear testing.  Problem # 3:  WEAKNESS (ICD-780.79) I watched her walk just last week and she has significant weakness in both of her legs.  More than she should have this far out from hip surgery suggesting a neurologic dx (either a lumbar stenosis or a central neurologic dz).  I talked to pt and her husband today about getting her into neurology and they are agreeable.   Orders: Neurology Referral (Neuro)  Problem # 4:  HIP PAIN, LEFT (QIH-474.25) She sees Dr Elesa Massed at [facility].  His suggestion was to remove the hardware in her hip if pain is not improving.  She will need to f/u with him for pain.   Her updated medication list for this problem includes:    Tylenol Extra Strength 500 Mg Tabs (Acetaminophen) .Andrea Munoz... 2 tabs by mouth three times a day as needed pain  Complete Medication List: 1)  Viactiv Chew (Calcium-vitamin d-vitamin k chew) .... Take 1 tablet by mouth once a day 2)  Amitriptyline Hcl 25 Mg Tabs (Amitriptyline hcl) .... 1/2 tab by mouth qhs 3)  Tylenol Extra Strength 500 Mg Tabs (Acetaminophen) .... 2 tabs by mouth three times a day as needed pain 4)  Omeprazole 40 Mg Cpdr (Omeprazole) .Andrea Munoz.. 1 tab by mouth daily, take 30 min before breakfast 5)  Furosemide 40 Mg Tabs (Furosemide) .Andrea Munoz.. 1 tab by mouth daily for leg swelling  Patient Instructions: 1)  Labs today. 2)  Will call you w/ results this afternoon. 3)  Will change Nuclear Thyroid test to [facility]. 4)  Will schedule you to see Dr Gaetano Net (neurologist) in [place] for weakness. 5)  Start on Furosemide 40 mg once daily for leg swelling.  Elevate legs.   6)  REturn in 1 wk to recheck legs.   Prescriptions: FUROSEMIDE 40 MG TABS (FUROSEMIDE) 1 tab by mouth daily for leg  swelling  #15 x 0   Entered and Authorized by:   Seymour Bars DO   Signed by:   Seymour Bars DO on 09/19/2009   Method used:   [facility]ron[facility] to        [facility] [phone]* (retail)       [address] [place], [place]  [address]       Ph: [phone] or [phone]       Fax: [phone]   RxID:   [id number]   Laboratory Results   Urine Tests    Routine Urinalysis  Color: yellow Appearance: Clear Glucose: negative   (Normal Range: Negative) Bilirubin: negative   (Normal Range: Negative) Ketone: negative   (Normal Range: Negative) Spec. Gravity: 1.020   (Normal Range: 1.003-1.035) Blood: trace-intact   (Normal Range: Negative) pH: 6.5   (Normal Range: 5.0-8.0) Protein: negative   (Normal Range: Negative) Urobilinogen: 0.2   (Normal Range: 0-1) Nitrite: negative   (Normal Range: Negative) Leukocyte Esterace: trace   (Normal Range: Negative)

## 2010-03-15 NOTE — Letter (Signed)
Summary: Depression Questionnaire  Depression Questionnaire   Imported By: Lanelle Bal 09/29/2009 12:01:16  _____________________________________________________________________  External Attachment:    Type:   Image     Comment:   External Document

## 2010-03-15 NOTE — Letter (Signed)
Summary: [facility]  [facility]   Imported By: Lanelle Bal 10/04/2009 10:22:22  _____________________________________________________________________  External Attachment:    Type:   Image     Comment:   External Document

## 2010-03-15 NOTE — Assessment & Plan Note (Signed)
Summary: leg edema   Vital Signs:  Patient profile:   70 year old female Height:      62 inches Weight:      150 pounds BMI:     27.53 O2 Sat:      95 % on Room air Pulse rate:   101 / minute BP sitting:   144 / 78  (left arm) Cuff size:   regular  Vitals Entered By: Payton Spark CMA (October 03, 2009 1:48 PM)  O2 Flow:  Room air CC: Leg swelling   Primary Care Provider:  Seymour Bars D.O.  CC:  Leg swelling.  History of Present Illness: 70 yo WF presents for HFU visit.  She was admitted to [facility] from 8/8 to 8/10 for an acute LLE DVT.  She has had chronic pain with increased LE edema over the past 2 wks.  She is off her Lasix, held by the hospital.  She had normal C and L spine xrays and an apparent neg w/u for hypercoagulable state.  She was fitted for compression hose but reports that they 'hurt' so she is not routinely wearing them.  She is ambulating w/ a walker and in PT but has missed a few sessions.  She also missed her initial appt with Dr Gaetano Net for leg weakness and refused to do an L spine MRI while in the hopsital.  As far as her clot, she denies SOB, chest pain.  She is overdue for a mammogram.  She still has her ovaries.  She is not a smoker and has a distant hx of melanoma.    Her husband is growing impatient and wants to bring her to the [facility] to 'figure things out'.      Anticoagulation Management History:      The patient is on coumadin and comes in today for a routine follow up visit.  She is being anticoagulated due to the first episode of deep venous thrombosis and/or pulmonary embolism.  Anticipated length of treatment is 3-6 months.  Her last INR was 2.24 and today's INR is 3.9.    Current Medications (verified): 1)  Viactiv   Chew (Calcium-Vitamin D-Vitamin K Chew) .... Take 1 Tablet By Mouth Once A Day 2)  Amitriptyline Hcl 25 Mg Tabs (Amitriptyline Hcl) .... 1/2 Tab By Mouth Qhs 3)  Tylenol Extra Strength 500 Mg Tabs  (Acetaminophen) .... 2 Tabs By Mouth Three Times A Day As Needed Pain 4)  Omeprazole 40 Mg Cpdr (Omeprazole) .Marland Kitchen.. 1 Tab By Mouth Daily, Take 30 Min Before Breakfast 5)  Furosemide 40 Mg Tabs (Furosemide) .Marland Kitchen.. 1 Tab By Mouth Daily For Leg Swelling 6)  Coumadin 5 Mg Tabs (Warfarin Sodium) .... Sunday - 5 Mg, Monday - 5 Mg, Tuesday - 5 Mg, Wednesday - 5 Mg, Thursday - 5 Mg, Friday - 5 Mg, Saturday - 5 Mg  Allergies (verified): 1)  Vicodin  Past History:  Past Medical History: G4P4 menopausal since age 69. 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 cardiac cath 1998- normal pinning of L hip fracture 02-2008  Social History: Reviewed history from 08/12/2006 and no changes required. Married to [place], retired.  Moved from [place] in 2004. 3 kids in [place], 1 in [place]. 3 grandkids.  Never smoked.  Denies ETOH. Gardens and walks 3 x a wk.  Fair diet.    Review of  Systems      See HPI  Physical Exam  General:  alert, well-developed, well-nourished, well-hydrated, and overweight-appearing.  ambulating with a walker Head:  normocephalic and atraumatic.   Eyes:  pupils equal, pupils round, and pupils reactive to light.   Mouth:  pharynx pink and moist.   Neck:  no masses.   Lungs:  Normal respiratory effort, chest expands symmetrically. Lungs are clear to auscultation, no crackles or wheezes. Heart:  Normal rate and regular rhythm. S1 and S2 normal without gallop, murmur, click, rub or other extra sounds. Msk:  no redness over joints.   Pulses:  2+ radial and pedal pulses Extremities:  L foot/ heel pitting edema - 2+ with venous stasis.  no calf pain with squeezing, trace edema on the R side Skin:  color normal.   Psych:  not anxious appearing and flat affect.     Impression & Recommendations:  Problem # 1:  DVT (ICD-453.40) Assessment New On coumadin x 2  wks now and off Lovenox.  Supratherapeutic.  Will cut back on her dose and recheck in 2 wks.  Still not sure why she had a DVT.  She needs to get updated on cancer screening.  Mammogram order printed today.  Consider getting a PET scan.    She is on Tylenol for pain and this has helped.  Has TED hose but does not think these are helping. Orders: Fingerstick (04540) Protime INR (98119) T-C-Reactive Protein ([id number])  Problem # 2:  LEG EDEMA, BILATERAL (ICD-782.3) L>R LE edema, from DVT.  Will recheck renail function.  She is not taking her Furosemide.  She is to do leg elevation and compression hose.  May need further eval for inguinal lymphadenopathy/ cancer screening. Her updated medication list for this problem includes:    Furosemide 40 Mg Tabs (Furosemide) .Marland Kitchen... 1 tab by mouth daily for leg swelling  Orders: T-Basic Metabolic Panel [id number]) T-C-Reactive Protein [phone])  Problem # 3:  WEAKNESS (ICD-780.79) This still could be from lumbar spinal stenosis but she refuses an MRI and has been somewhat non compliant with PT.  She is to f/u with Dr Gaetano Net for her weakness which may be central or peripheral in etiology.    Complete Medication List: 1)  Viactiv Chew (Calcium-vitamin d-vitamin k chew) .... Take 1 tablet by mouth once a day 2)  Amitriptyline Hcl 25 Mg Tabs (Amitriptyline hcl) .... 1/2 tab by mouth qhs 3)  Tylenol Extra Strength 500 Mg Tabs (Acetaminophen) .... 2 tabs by mouth three times a day as needed pain 4)  Omeprazole 40 Mg Cpdr (Omeprazole) .Marland Kitchen.. 1 tab by mouth daily, take 30 min before breakfast 5)  Furosemide 40 Mg Tabs (Furosemide) .Marland Kitchen.. 1 tab by mouth daily for leg swelling 6)  Coumadin 5 Mg Tabs (Warfarin sodium) .... Sunday - 5 mg, monday - 2.5 mg, tuesday - 5 mg, wednesday - 2.5 mg, thursday - 5 mg, friday - 2.5 mg, saturday - 5 mg  Other Orders: T-Mammography Bilateral Screening (44010)  Anticoagulation Management Assessment/Plan:      The target  INR is 2.0-3.0.  She is to have a PT/INR in 2 weeks.  Anticoagulation instructions were given to patient.         Current Anticoagulation Instructions: Hold today's coumadin dose.  Coumadin 5 mg tabs:  Sunday - 5 mg, Monday - 2.5 mg, Tuesday - 5 mg, Wednesday - 2.5 mg, Thursday - 5 mg, Friday - 2.5 mg, Saturday - 5 mg.    Repeat  PT/INR in 2 weeks.    Laboratory Results   Blood Tests      INR: 3.9   (Normal Range: 0.88-1.12   Therap INR: 2.0-3.5)

## 2010-03-15 NOTE — Assessment & Plan Note (Signed)
Summary: DVT f/u   Vital Signs:  Patient profile:   70 year old female Height:      62 inches Weight:      152 pounds BMI:     27.90 O2 Sat:      97 % on Room air Pulse rate:   86 / minute BP sitting:   139 / 73  (right arm) Cuff size:   regular  Vitals Entered By: Payton Spark CMA (October 26, 2009 3:13 PM)  O2 Flow:  Room air CC: F/U.    Primary Care Salvatrice Morandi:  Seymour Bars D.O.  CC:  F/U. Marland Kitchen  History of Present Illness: 70 yo WF presents for f/u LLE DVT.  This is her note from last time.   70 yo WF presents for HFU visit.  She was admitted to [facility] from 8/8 to 8/10 for an acute LLE DVT.  She has had chronic pain with increased LE edema over the past 2 wks.  She is off her Lasix, held by the hospital.  She had normal C and L spine xrays and an apparent neg w/u for hypercoagulable state.  She was fitted for compression hose but reports that they 'hurt' so she is not routinely wearing them.  She is ambulating w/ a walker and in PT but has missed a few sessions.  She also missed her initial appt with Dr Gaetano Net for leg weakness and refused to do an L spine MRI while in the hopsital.  As far as her clot, she denies SOB, chest pain.  She is overdue for a mammogram.  She still has her ovaries.  She is not a smoker and has a distant hx of melanoma.    Her husband is growing impatient and wants to bring her to the [facility] to 'figure things out'.    Since this visit, she is feeling much better.  She denies any pain and has had a drop in her leg swelling.  Doing PT and ambulating more freely w/ her walker.  She is happier.  Due for INR today.      Anticoagulation Management History:      The patient is on coumadin and comes in today for a routine follow up visit.  She is being anticoagulated due to the first episode of deep venous thrombosis and/or pulmonary embolism.  Anticipated length of treatment is 3-6 months.  Her last INR was 3.9 and today's INR is  1.6.    Current Medications (verified): 1)  Viactiv   Chew (Calcium-Vitamin D-Vitamin K Chew) .... Take 1 Tablet By Mouth Once A Day 2)  Tylenol Extra Strength 500 Mg Tabs (Acetaminophen) .... 2 Tabs By Mouth Three Times A Day As Needed Pain 3)  Omeprazole 40 Mg Cpdr (Omeprazole) .Marland Kitchen.. 1 Tab By Mouth Daily, Take 30 Min Before Breakfast 4)  Furosemide 40 Mg Tabs (Furosemide) .Marland Kitchen.. 1 Tab By Mouth Daily For Leg Swelling 5)  Coumadin 5 Mg Tabs (Warfarin Sodium) .... Sunday - 5 Mg, Monday - 2.5 Mg, Tuesday - 5 Mg, Wednesday - 2.5 Mg, Thursday - 5 Mg, Friday - 2.5 Mg, Saturday - 5 Mg 6)  Vesicare 5 Mg Tabs (Solifenacin Succinate)  Allergies (verified): 1)  Vicodin  Past History:  Past Medical History: Reviewed history from 10/03/2009 and no changes required. G4P4 menopausal since age 50. hx of melanoma hx of benign breast mass 2004 upper GI bleed 03-2008 LLE DVT 09-2009  Past Surgical History: Reviewed history from 03/25/2008 and no  changes required. cardiac cath 1998- normal pinning of L hip fracture 02-2008  Family History: Reviewed history from 08/12/2006 and no changes required. mother colon cancer, died at 21 father died at 24, AMI in 95's, valvular dz sister alive and healthy  Social History: Reviewed history from 08/12/2006 and no changes required. Married to [employer], retired.  Moved from [place] in 2004. 3 kids in [place], 1 in [place]. 3 grandkids.  Never smoked.  Denies ETOH. Gardens and walks 3 x a wk.  Fair diet.    Review of Systems      See HPI  Physical Exam  General:  alert, well-developed, well-nourished, and well-hydrated.  here with husband, using rolling walker Head:  normocephalic and atraumatic.   Neck:  no masses.   Extremities:  1+ pitting LLE edema 1/2 up the leg Skin:  color normal.   Psych:  good eye contact, not anxious appearing, and not depressed appearing.  improved!   Impression & Recommendations:  Problem # 1:  DVT (ICD-453.40) Adjusted her  coumadin dose.  Recheck in 2 wks.  Take Lasix every other day and do leg elevation, compression socks and increase walking to diminish edema.  She has been non adherent with all 3.  She is going to update her mammogram.  She is going to stay on coumadin x 6 mos.   Orders: Fingerstick (64403) Protime ([id number])  Complete Medication List: 1)  Viactiv Chew (Calcium-vitamin d-vitamin k chew) .... Take 1 tablet by mouth once a day 2)  Tylenol Extra Strength 500 Mg Tabs (Acetaminophen) .... 2 tabs by mouth three times a day as needed pain 3)  Omeprazole 40 Mg Cpdr (Omeprazole) .Marland Kitchen.. 1 tab by mouth daily, take 30 min before breakfast 4)  Furosemide 40 Mg Tabs (Furosemide) .Marland Kitchen.. 1 tab by mouth daily for leg swelling 5)  Vesicare 5 Mg Tabs (Solifenacin succinate) 6)  Coumadin 5 Mg Tabs (Warfarin sodium) .... "Sunday - 5 mg, monday - 2.5 mg, tuesday - 5 mg, wednesday - 5 mg, thursday - 5 mg, friday - 2.5 mg, saturday - 5 mg  Anticoagulation Management Assessment/Plan:      The target INR is 2.0-3.0.  Anticoagulation instructions were given to patient.         Current Anticoagulation Instructions: The patient's dosage of coumadin will be increased.  The new dosage includes:  Coumadin 5 mg tabs:  Sunday - 5 mg, Monday - 2.5 mg, Tuesday - 5 mg, Wednesday - 5 mg, Thursday - 5 mg, Friday - 2.5 mg, Saturday - 5 mg.      Patient Instructions: 1)  Take Furosemide 1 tab every OTHER day for leg swelling. 2)  REturn for a nurse visit INR in 2 wks. 3)  and flu shot. 4)  Return for f/u DVT/ leg edema in 2 mos. 5)  Rodney Flannigan:  Change Lisinopirl to Lotrel once daily in the AM and stay on Metoprolol. Prescriptions: COUMADIN 5 MG TABS (WARFARIN SODIUM) Sunday - 5 mg, Monday - 2.5 mg, Tuesday - 5 mg, Wednesday - 5 mg, Thursday - 5 mg, Friday - 2.5 mg, Saturday - 5 mg  #30 x 1   Entered and Authorized by:   Karen Bowen DO   Signed by:   Karen Bowen DO on 10/26/2009   Method used:   Electronically to        [facility]   [address] [id number] ([facility]       [address]  [place], [place]  [address]       Ph: [phone] or [phone]       Fax: [phone]   RxID:   [id number]   Laboratory Results   Blood Tests      INR: 1.6   (Normal Range: 0.88-1.12   Therap INR: 2.0-3.5)

## 2010-03-15 NOTE — Consult Note (Signed)
Summary: [facility]  [facility]   Imported By: Lanelle Bal 01/17/2010 13:00:32  _____________________________________________________________________  External Attachment:    Type:   Image     Comment:   External Document

## 2010-03-16 NOTE — Assessment & Plan Note (Signed)
Summary: INR  Nurse Visit   Allergies: 1)  Vicodin Laboratory Results   Blood Tests   Date/Time Received: 02/24/11 Date/Time Reported: 02/24/11   INR: 2.2   (Normal Range: 0.88-1.12   Therap INR: 2.0-3.5)    Orders Added: 1)  Fingerstick [36416] 2)  Protime INR [[id number]]   Anticoagulation Management History:      The patient is on coumadin and comes in today for a routine follow up visit.  Coumadin therapy is being given due to the first episode of deep venous thrombosis and/or pulmonary embolism.  Anticipated length of treatment is 3-6 months.  Her last INR was 2.5 and today's INR is 2.2.    Anticoagulation Management Assessment/Plan:      The target INR is 2.0-3.0.  She is to have a PT/INR in 4 weeks.  Anticoagulation instructions were given to patient.         Current Anticoagulation Instructions: The patient is to continue with the same dose of coumadin.  This dosage includes:   Coumadin 5 mg tabs:  Sunday - 5 mg, Monday - 5 mg, Tuesday - 5 mg, Wednesday - 5 mg, Thursday - 5 mg, Friday - 2.5 mg, Saturday - 5 mg.    Repeat PT/INR in 4 weeks.

## 2010-03-24 ENCOUNTER — Ambulatory Visit: Payer: Medicare Other | Admitting: Family Medicine

## 2010-03-24 ENCOUNTER — Encounter: Payer: Self-pay | Admitting: Family Medicine

## 2010-03-24 ENCOUNTER — Ambulatory Visit (INDEPENDENT_AMBULATORY_CARE_PROVIDER_SITE_OTHER): Payer: Medicare Other | Admitting: Family Medicine

## 2010-03-24 DIAGNOSIS — G20 Parkinson's disease: Secondary | ICD-10-CM

## 2010-03-24 DIAGNOSIS — Z7901 Long term (current) use of anticoagulants: Secondary | ICD-10-CM

## 2010-03-24 DIAGNOSIS — E0590 Thyrotoxicosis, unspecified without thyrotoxic crisis or storm: Secondary | ICD-10-CM

## 2010-03-24 DIAGNOSIS — I82409 Acute embolism and thrombosis of unspecified deep veins of unspecified lower extremity: Secondary | ICD-10-CM

## 2010-03-24 DIAGNOSIS — G20A1 Parkinson's disease without dyskinesia, without mention of fluctuations: Secondary | ICD-10-CM

## 2010-03-29 ENCOUNTER — Telehealth (INDEPENDENT_AMBULATORY_CARE_PROVIDER_SITE_OTHER): Payer: Self-pay | Admitting: *Deleted

## 2010-04-05 NOTE — Assessment & Plan Note (Signed)
Summary: f/u DVT   Vital Signs:  Patient profile:   70 year old female Height:      62 inches Weight:      162 pounds BMI:     29.74 O2 Sat:      96 % on Room air Pulse rate:   73 / minute BP sitting:   130 / 70  (left arm) Cuff size:   regular  Vitals Entered By: Payton Spark CMA (March 24, 2010 2:00 PM)  O2 Flow:  Room air CC: F/U.    Primary Care Provider:  Seymour Bars D.O.  CC:  F/U. Marland Kitchen  History of Present Illness: 70 yo WF presents for f/u visit.  She has completed her 6 mos of coumadin following a LLE DVT of unknown cause other than the fax that she had a L hip fx Jan 2010 and had been more sedentary.  This was her first clot.  She has not rather well on coumadin w/o problems.  She saw Dr Gaetano Net back sicne our last visit and she was diagnosed with Parkinson's Dz.  She was started on Sinemet and it has made a remarkable difference.  She is able to ambulate now w/o assistance and her energy level and mood are much improved.  I had been concerned for months about her gait with profound weakness though she has never had a tremor.  Denies any SEs or memory loss.      Anticoagulation Management History:      Anticoagulation is being administered due to the first episode of deep venous thrombosis and/or pulmonary embolism.  Anticipated length of treatment is 3-6 months.  Her last INR was 2.2 and today's INR is 2.1.    Current Medications (verified): 1)  Viactiv   Chew (Calcium-Vitamin D-Vitamin K Chew) .... Take 1 Tablet By Mouth Once A Day 2)  Tylenol Extra Strength 500 Mg Tabs (Acetaminophen) .... 2 Tabs By Mouth Three Times A Day As Needed Pain 3)  Omeprazole 40 Mg Cpdr (Omeprazole) .Marland Kitchen.. 1 Tab By Mouth Daily, Take 30 Min Before Breakfast 4)  Vesicare 5 Mg Tabs (Solifenacin Succinate) 5)  Coumadin 5 Mg Tabs (Warfarin Sodium) .... Sunday - 5 Mg, Monday - 5 Mg, Tuesday - 5 Mg, Wednesday - 5 Mg, Thursday - 5 Mg, Friday - 2.5 Mg, Saturday - 5 Mg 6)  Carbidopa-Levodopa 25-100  Mg Tbdp (Carbidopa-Levodopa) .... Take 1 Tab By Mouth Three Times A Day  Allergies (verified): 1)  Vicodin  Past History:  Past Medical History: G4P4 menopausal since age 29. hx of melanoma hx of benign breast mass 2004 upper GI bleed 03-2008 LLE DVT 09-2009 Parkinsons Dz 02-2010 (Dr Gaetano Net)  Past Surgical History: Reviewed history from 03/25/2008 and no changes required. cardiac cath 1998- normal pinning of L hip fracture 02-2008  Family History: Reviewed history from 08/12/2006 and no changes required. mother colon cancer, died at 50 father died at 66, AMI in 51's, valvular dz sister alive and healthy  Social History: Reviewed history from 08/12/2006 and no changes required. Married to [employer], retired.  Moved from [place] in 2004. 3 kids in [place], 1 in [place]. 3 grandkids.  Never smoked.  Denies ETOH. Gardens and walks 3 x a wk.  Fair diet.    Review of Systems      See HPI  Physical Exam  General:  alert, well-developed, well-nourished, and well-hydrated.   Head:  normocephalic and atraumatic.  no masked facies Mouth:  pharynx pink and moist.   Neck:  no masses.   Lungs:  Normal respiratory effort, chest expands symmetrically. Lungs are clear to auscultation, no crackles or wheezes. Heart:  Normal rate and regular rhythm. S1 and S2 normal without gallop, murmur, click, rub or other extra sounds. Extremities:  trace LE edema bilat w/o L calf tenderness Neurologic:  no tremor much improved gait Skin:  color normal.   Psych:  good eye contact, not anxious appearing, and not depressed appearing.     Impression & Recommendations:  Problem # 1:  DVT (ICD-453.40) She will finish out her 6 mos of Coumadin this wk then stop.  Perfect today.  Still not sure the cause of her DVT other than immobility following her L hip fx in Jan 2010.  I'd like to get her cancer screening up to date and get a hypercoagulable panel done in 4-6 wks.  She is OVERDUE for skin check (hx of  melanoma), colonoscopy and mammogram.   Orders: Fingerstick (16109) Protime ([id number])  Problem # 2:  HYPERTHYROIDISM (ICD-242.90) Due to recheck. Orders: T-TSH [phone])  Labs Reviewed: TSH: 0.080 (09/13/2009)     Problem # 3:  PARKINSON'S DISEASE (ICD-332.0) Assessment: New Newly diagnosed by Dr Gaetano Net and has had remarkable improvements on Sinemet. will obtain records. Her mood has also much improved.  Complete Medication List: 1)  Viactiv Chew (Calcium-vitamin d-vitamin k chew) .... Take 1 tablet by mouth once a day 2)  Tylenol Extra Strength 500 Mg Tabs (Acetaminophen) .... 2 tabs by mouth three times a day as needed pain 3)  Omeprazole 40 Mg Cpdr (Omeprazole) .Marland Kitchen.. 1 tab by mouth daily, take 30 min before breakfast 4)  Vesicare 5 Mg Tabs (Solifenacin succinate) 5)  Carbidopa-levodopa 25-100 Mg Tbdp (Carbidopa-levodopa) .... Take 1 tab by mouth three times a day 6)  Coumadin 5 Mg Tabs (Warfarin sodium) .... Sunday - 5 mg, monday - 5 mg, tuesday - 5 mg, wednesday - 5 mg, thursday - 5 mg, friday - 2.5 mg, saturday - 5 mg  Other Orders: T-CBC No Diff ([id number]) T-Comprehensive Metabolic Panel ([id number]) T-Lipid Profile ([id number]) T-Vitamin D (25-Hydroxy) (72536-64403)  Anticoagulation Management Assessment/Plan:      The target INR is 2.0-3.0.  Anticoagulation instructions were given to patient.         Current Anticoagulation Instructions: The patient is to continue with the same dose of coumadin.  This dosage includes:   Coumadin 5 mg tabs:  Sunday - 5 mg, Monday - 5 mg, Tuesday - 5 mg, Wednesday - 5 mg, Thursday - 5 mg, Friday - 2.5 mg, Saturday - 5 mg.      Patient Instructions: 1)  Take Coumadin until next FRI --  then STOP. 2)  Keep up the good work and I will get your neuro records. 3)  Update fasting labs. 4)  Return for f/u in 4 mos.   Orders Added: 1)  Fingerstick [36416] 2)  Protime [[id number]] 3)  T-CBC No Diff [[id number]] 4)   T-Comprehensive Metabolic Panel [[id number]] 5)  T-Lipid Profile [[id number]] 6)  T-TSH [[id number]] 7)  T-Vitamin D (25-Hydroxy) [[id number]] 8)  Est. Patient Level IV [88416]    Laboratory Results   Blood Tests      INR: 2.1   (Normal Range: 0.88-1.12   Therap INR: 2.0-3.5)

## 2010-04-05 NOTE — Progress Notes (Signed)
Summary: [facility]-Vesicare refill  Phone Note Call from Patient Call back at Home Phone [phone]   Caller: spouse-Rod Call For: Andrea Bars DO Reason for Call: Refill Medication Summary of Call: pt needs refill for [facility]  She would like 90 day with 3 refills.  The correct fax number is [phone] (verified with Tammy Sours at [facility]). Initial call taken by: Francee Piccolo CMA Duncan Dull),  March 29, 2010 2:24 PM    Prescriptions: VESICARE 5 MG TABS (SOLIFENACIN SUCCINATE)   #90 x 1   Entered by:   Payton Spark CMA   Authorized by:   Andrea Bars DO   Signed by:   Payton Spark CMA on 03/29/2010   Method used:   Faxed to ...       [facility] (mail-order)       [address]       [place], [place]  [address]       Ph: [phone]       Fax: [phone]   RxID:   [id number]

## 2010-04-10 ENCOUNTER — Telehealth (INDEPENDENT_AMBULATORY_CARE_PROVIDER_SITE_OTHER): Payer: Self-pay | Admitting: *Deleted

## 2010-04-10 ENCOUNTER — Encounter: Payer: Self-pay | Admitting: Family Medicine

## 2010-04-19 ENCOUNTER — Encounter: Payer: Self-pay | Admitting: Family Medicine

## 2010-04-19 ENCOUNTER — Other Ambulatory Visit: Payer: Self-pay | Admitting: Family Medicine

## 2010-04-19 LAB — CONVERTED CEMR LAB
HCT: 42.4 % (ref 36.0–46.0)
Hemoglobin: 14.1 g/dL (ref 12.0–15.0)
Platelets: 233 10*3/uL (ref 150–400)
RBC: 4.71 M/uL (ref 3.87–5.11)
WBC: 5.6 10*3/uL (ref 4.0–10.5)

## 2010-04-20 ENCOUNTER — Encounter: Payer: Self-pay | Admitting: Family Medicine

## 2010-04-20 NOTE — Progress Notes (Signed)
       New/Updated Medications: VESICARE 5 MG TABS (SOLIFENACIN SUCCINATE) Take 1 tab by mouth once daily Prescriptions: VESICARE 5 MG TABS (SOLIFENACIN SUCCINATE) Take 1 tab by mouth once daily  #90 x 1   Entered by:   Payton Spark CMA   Authorized by:   Seymour Bars DO   Signed by:   Payton Spark CMA on 04/10/2010   Method used:   Electronically to        [facility] (mail-order)       [address]       [place], [place]  [address]       Ph: [phone]       Fax: [phone]   RxID:   [id number]

## 2010-04-20 NOTE — Letter (Signed)
Summary: Generic Letter  [facility]  [address], [address]   [place], [place] [address]   Phone: [phone]  Fax: [phone]    04/10/2010  Andrea Munoz [address] [place], [place]  [address]  [place]  Dear [name],  Congratulations on competing six months of coumadin for your DVT.  It is now time to complete your work- up for blood clots.  This includes checking bloodwork for hypercoagulable state (have drawn at the lab in 4 weeks- lab order attached).  This is a NON - fasting lab.  Also, we need to make sure your cancer screening is up to date.  After a review of your chart, it looks like you are due for a dermatology visit given history of skin cancer, a colonoscopy and a mammogram.  I have attached the order to update your mammogram downstairs in our building at your convenience.  Please let me know who you would like to see for dermatology and gastroenterology.  If you do not have any preferences, I will be happy to schedule these for you.  Thanks and take care.      Sincerely,    Seymour Bars DO  Appended Document: Generic Letter

## 2010-04-21 LAB — CONVERTED CEMR LAB
ALT: <8 units/L (ref 0–35)
AST: 17 units/L (ref 0–37)
Albumin: 4.2 g/dL (ref 3.5–5.2)
Alkaline Phosphatase: 124 units/L — ABNORMAL HIGH (ref 39–117)
BUN: 13 mg/dL (ref 6–23)
CO2: 23 meq/L (ref 19–32)
Calcium: 9.5 mg/dL (ref 8.4–10.5)
Chloride: 105 meq/L (ref 96–112)
Cholesterol: 190 mg/dL (ref 0–200)
Free T4: 1.06 ng/dL (ref 0.80–1.80)
Glucose, Bld: 103 mg/dL — ABNORMAL HIGH (ref 70–99)
HDL: 55 mg/dL (ref >39)
Sodium: 141 meq/L (ref 135–145)
Triglycerides: 91 mg/dL (ref <150)
Vit D, 25-Hydroxy: 34 ng/mL (ref 30–89)

## 2010-04-21 LAB — T3, FREE: T3, Free: 3.3 pg/mL (ref 2.3–4.2)

## 2010-04-21 LAB — T4, FREE: Free T4: 1.06 ng/dL (ref 0.80–1.80)

## 2010-04-26 LAB — CONVERTED CEMR LAB
Anticardiolipin IgA: 11 (ref <22)
Anticardiolipin IgG: 7 (ref <23)
Anticardiolipin IgM: 6 (ref <11)
Protein C Activity: 139 % — ABNORMAL HIGH (ref 75–133)

## 2010-09-14 ENCOUNTER — Other Ambulatory Visit: Payer: Self-pay | Admitting: *Deleted

## 2010-09-14 MED ORDER — SOLIFENACIN SUCCINATE 5 MG PO TABS: 5.0000 mg | ORAL_TABLET | Freq: Every day | ORAL | Status: DC

## 2010-09-19 ENCOUNTER — Encounter: Payer: Self-pay | Admitting: Family Medicine

## 2010-11-23 IMAGING — CR DG HIP (WITH OR WITHOUT PELVIS) 2-3V*L*
2 series · 2 of 2 positions shown · non-contrast
Comparison: 02/16/2008

CLINICAL DATA: Hip pain

LEFT HIP - COMPLETE 2+ VIEW

[view not recorded (1 of 2)]
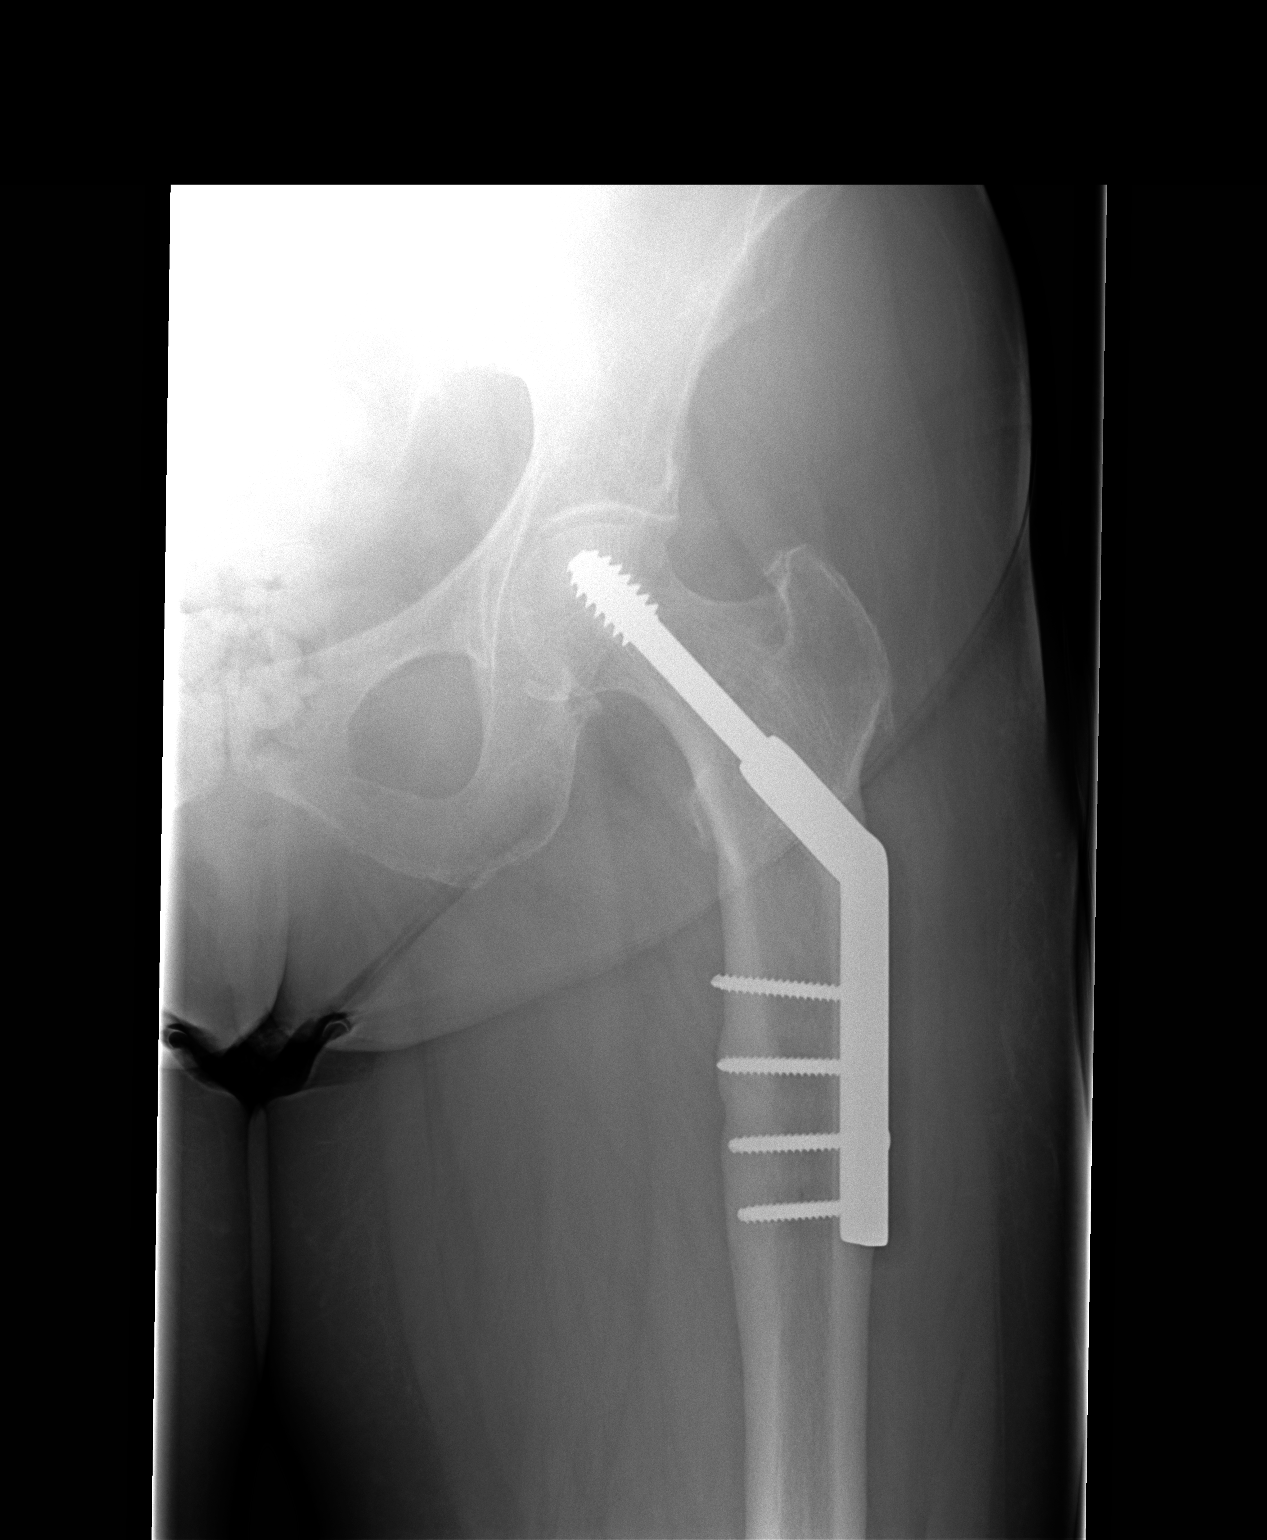

[view not recorded (2 of 2)]
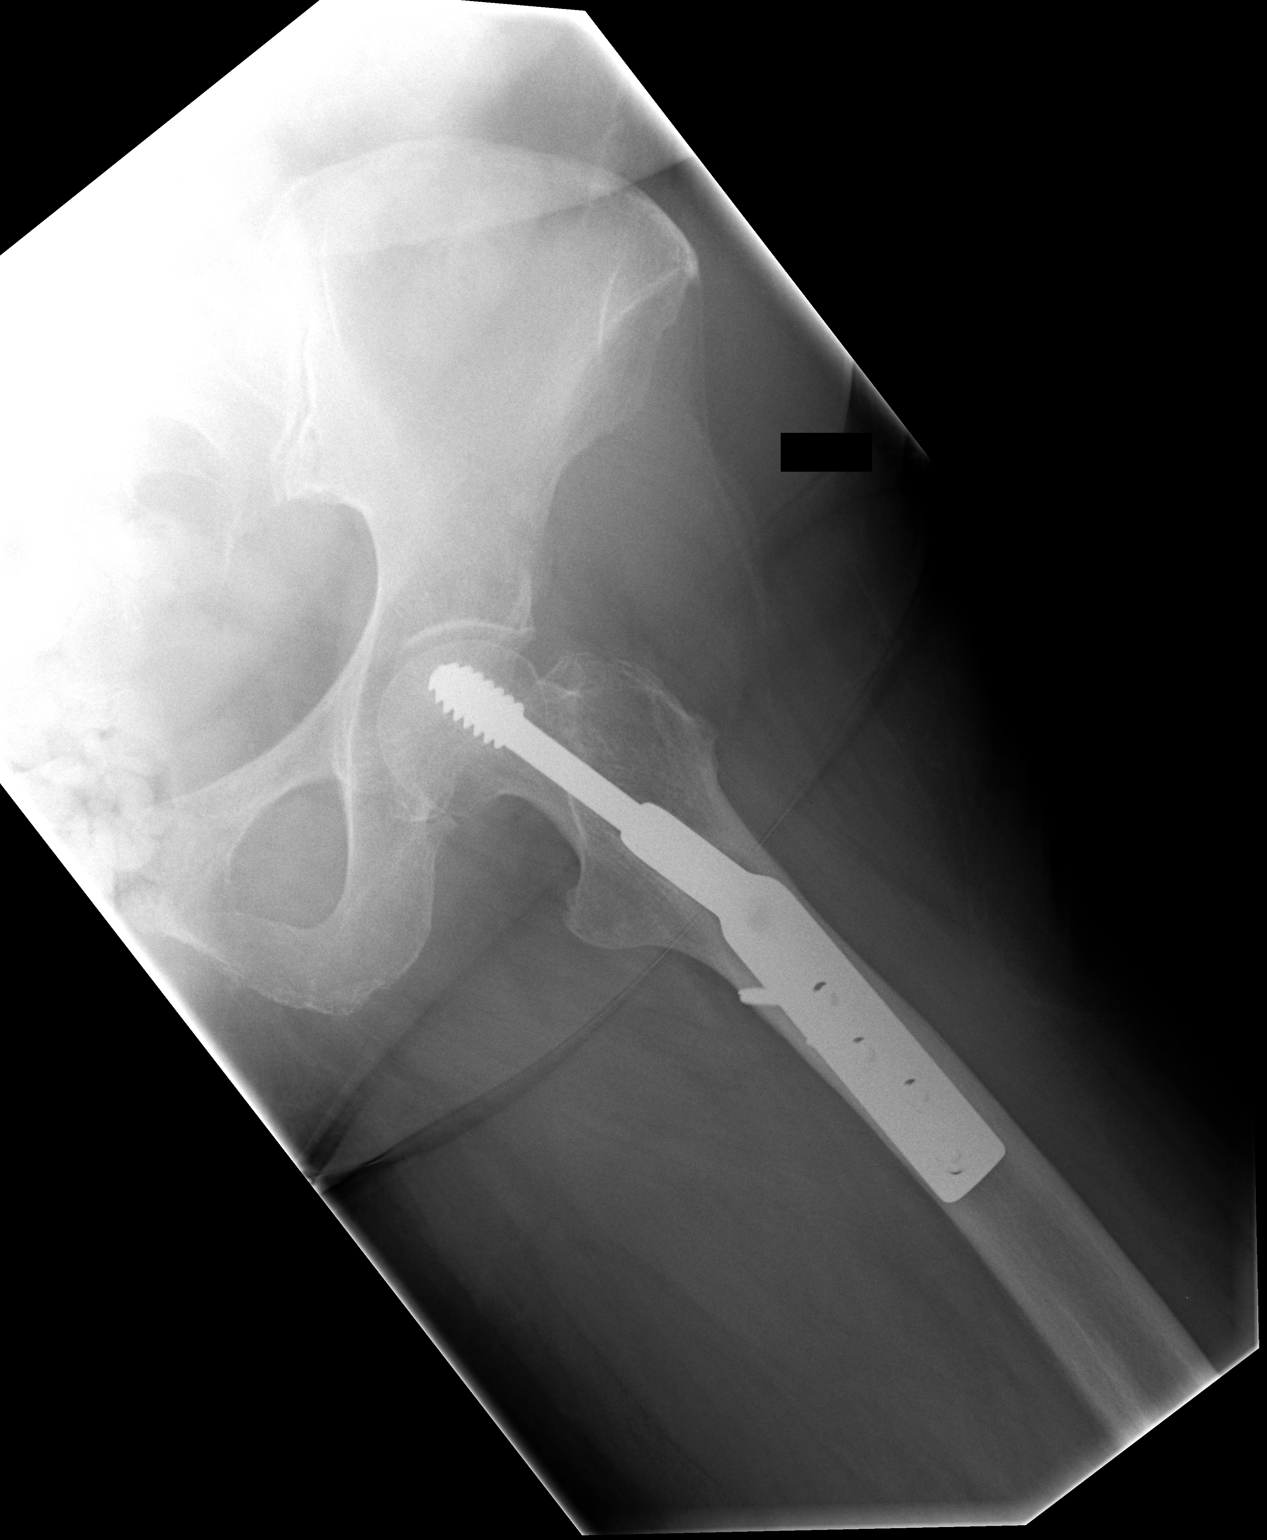

[2 of 2 positions shown; findings below may reference images not displayed]

FINDINGS: Compression screw and plate in the left femur is in
satisfactory position alignment.  No evidence of loosening or
infection.  No fracture is identified.
IMPRESSION: Satisfactory hardware in the left femur.  No acute radiographic
abnormality.

## 2011-05-23 ENCOUNTER — Other Ambulatory Visit: Payer: Self-pay | Admitting: Family Medicine

## 2011-09-24 ENCOUNTER — Other Ambulatory Visit: Payer: Self-pay | Admitting: *Deleted

## 2011-09-24 MED ORDER — SOLIFENACIN SUCCINATE 5 MG PO TABS: 5.0000 mg | ORAL_TABLET | Freq: Every day | ORAL | Status: DC

## 2012-11-28 ENCOUNTER — Telehealth: Payer: Self-pay | Admitting: *Deleted

## 2012-11-28 MED ORDER — SOLIFENACIN SUCCINATE 5 MG PO TABS: 5.0000 mg | ORAL_TABLET | Freq: Every day | ORAL | Status: DC

## 2012-11-28 NOTE — Telephone Encounter (Signed)
Pt's daughter called and stated that pt's husband passed and that she need a refill on her vesicare.Loralee Pacas [place]

## 2012-12-01 ENCOUNTER — Other Ambulatory Visit: Payer: Self-pay | Admitting: *Deleted

## 2012-12-01 MED ORDER — SOLIFENACIN SUCCINATE 5 MG PO TABS: 5.0000 mg | ORAL_TABLET | Freq: Every day | ORAL | Status: DC

## 2013-01-02 ENCOUNTER — Ambulatory Visit (INDEPENDENT_AMBULATORY_CARE_PROVIDER_SITE_OTHER): Payer: Medicare Other | Admitting: Family Medicine

## 2013-01-02 ENCOUNTER — Ambulatory Visit (INDEPENDENT_AMBULATORY_CARE_PROVIDER_SITE_OTHER): Payer: Medicare Other

## 2013-01-02 VITALS — BP 121/73 | HR 79 | Wt 160.0 lb

## 2013-01-02 DIAGNOSIS — S91012A Laceration without foreign body, left ankle, initial encounter: Secondary | ICD-10-CM

## 2013-01-02 DIAGNOSIS — M25569 Pain in unspecified knee: Secondary | ICD-10-CM

## 2013-01-02 DIAGNOSIS — S81009A Unspecified open wound, unspecified knee, initial encounter: Secondary | ICD-10-CM

## 2013-01-02 DIAGNOSIS — N6320 Unspecified lump in the left breast, unspecified quadrant: Secondary | ICD-10-CM

## 2013-01-02 DIAGNOSIS — R3915 Urgency of urination: Secondary | ICD-10-CM | POA: Insufficient documentation

## 2013-01-02 DIAGNOSIS — M25561 Pain in right knee: Secondary | ICD-10-CM

## 2013-01-02 DIAGNOSIS — G20 Parkinson's disease: Secondary | ICD-10-CM

## 2013-01-02 DIAGNOSIS — N63 Unspecified lump in unspecified breast: Secondary | ICD-10-CM

## 2013-01-02 MED ORDER — SOLIFENACIN SUCCINATE 5 MG PO TABS: 5.0000 mg | ORAL_TABLET | Freq: Every day | ORAL | Status: DC

## 2013-01-02 NOTE — Progress Notes (Signed)
  Subjective:    Patient ID: Andrea Munoz, female    DOB: 27-Jun-1940, 72 y.o.   MRN: [id number]  HPI Urinary urgency - doing well on the vesicare.  No dry mouth. Gets up once at night.    Parkinson's- follows at [facility].  They inc sinemet to QID.    Lump in the left breast about 6 months. Sometimes itches.  Not painful.  No personal history or family history of breast cancer.  Right knee pain on and off for months- Uses tylenol which helps some.  Pain is mostly on the inside. Not giving out or locking.  At first thought was her shoe wear so changed shoes.   2 nights ago noticed bood on on the floor. She looked down and noticed that there was a thin spot on her right lower leg that was bleeding. She did not remove her any trauma or laceration to the area. She called the nurse on call and recommended compression and and washing gently with warm water. It finally stopped bleeding. She did call her daughter to come help her. Review of Systems     Objective:   Physical Exam  Constitutional: She is oriented to person, place, and time. She appears well-developed and well-nourished.  HENT:  Head: Normocephalic and atraumatic.  Cardiovascular: Normal rate, regular rhythm and normal heart sounds.   Pulmonary/Chest: Effort normal and breath sounds normal.  She has an approximately 1 x 1 cm firm irregular nodule that causes feeding of the breast tissue approximately 4 cm from the edge of the areola at the 9 o'clock position on the left breast.  Musculoskeletal:  Right knee with crepitus. She's very tender along the medial joint line just above the medial joint line. Also some significant edema there. Nontender laterally. Negative anterior drawer posterior drawer test. Patellar reflexes 1+ bilaterally.  Neurological: She is alert and oriented to person, place, and time.  Skin: Skin is warm and dry.  Small laceration along the left lower ankle. She has a lot of varicose veins in these areas  suspect that the vein was broke in and that's what caused such significant bleeding. It appears to be healing well today with no erythema or drainage. There is also an erythematous scaly patch on her left elbow and on the right lower medial leg, near her ankle. She says it doesn't really bother her. No itching or irritation.  Psychiatric: She has a normal mood and affect. Her behavior is normal.          Assessment & Plan:  Urinary urgency - will refill VESIcare 5 mg for one year. She tolerates it well and has not had any side effects with it  Left breast lump-will refer for diagnostic mammogram of the left breast. Lesion very worrisome for malignancy.  Small laceration to right lower leg-it appears to be healing well. No sign of erythema or infection.. Placement dated for today.  Right knee with significant tenderness over the medial joint line and just above the medial joint line. Nontender laterally. She also has some trace edema medially. Suspect significant osteoarthritis. Will get x-ray today.  Parkinson's-tremor fairly well controlled with Sinemet.

## 2013-01-02 NOTE — Patient Instructions (Signed)
Ice the knee as needed. Recommend Aleve or ibuprofen as needed. Make sure take with food and water to avoid any GI upset or irritation. We will call you with the x-ray results on Monday.

## 2013-01-15 ENCOUNTER — Other Ambulatory Visit: Payer: Self-pay | Admitting: *Deleted

## 2013-01-15 DIAGNOSIS — N6320 Unspecified lump in the left breast, unspecified quadrant: Secondary | ICD-10-CM

## 2013-01-15 DIAGNOSIS — N63 Unspecified lump in unspecified breast: Secondary | ICD-10-CM

## 2013-01-23 ENCOUNTER — Telehealth: Payer: Self-pay | Admitting: Family Medicine

## 2013-01-23 ENCOUNTER — Encounter: Payer: Self-pay | Admitting: Family Medicine

## 2013-01-23 DIAGNOSIS — C50919 Malignant neoplasm of unspecified site of unspecified female breast: Secondary | ICD-10-CM | POA: Insufficient documentation

## 2013-01-23 NOTE — Telephone Encounter (Signed)
Called and spoke to patient regarding her recent diagnosis of breast cancer. She has a followup appointment this afternoon for surgical consult. She says she's doing well overall and is hopeful that things will go. I encouraged her call her office at any time if she has any problems concerns or if there's anything that we can be helpful with on our end

## 2013-02-04 ENCOUNTER — Encounter: Payer: Self-pay | Admitting: Family Medicine

## 2013-02-19 ENCOUNTER — Encounter: Payer: Self-pay | Admitting: Family Medicine

## 2013-06-18 ENCOUNTER — Encounter: Payer: Self-pay | Admitting: Family Medicine

## 2013-06-18 ENCOUNTER — Other Ambulatory Visit: Payer: Self-pay | Admitting: Family Medicine

## 2013-06-18 ENCOUNTER — Ambulatory Visit (INDEPENDENT_AMBULATORY_CARE_PROVIDER_SITE_OTHER): Payer: Medicare Other

## 2013-06-18 ENCOUNTER — Ambulatory Visit (INDEPENDENT_AMBULATORY_CARE_PROVIDER_SITE_OTHER): Payer: Medicare Other | Admitting: Family Medicine

## 2013-06-18 VITALS — BP 113/55 | HR 93 | Ht 64.0 in | Wt 152.0 lb

## 2013-06-18 DIAGNOSIS — R918 Other nonspecific abnormal finding of lung field: Secondary | ICD-10-CM

## 2013-06-18 DIAGNOSIS — J019 Acute sinusitis, unspecified: Secondary | ICD-10-CM

## 2013-06-18 DIAGNOSIS — R059 Cough, unspecified: Secondary | ICD-10-CM

## 2013-06-18 DIAGNOSIS — J40 Bronchitis, not specified as acute or chronic: Secondary | ICD-10-CM

## 2013-06-18 DIAGNOSIS — R05 Cough: Secondary | ICD-10-CM

## 2013-06-18 DIAGNOSIS — J189 Pneumonia, unspecified organism: Secondary | ICD-10-CM

## 2013-06-18 MED ORDER — LEVOFLOXACIN 500 MG PO TABS: 500.0000 mg | ORAL_TABLET | Freq: Every day | ORAL | Status: AC

## 2013-06-18 NOTE — Progress Notes (Signed)
   Subjective:    Patient ID: Andrea Munoz, female    DOB: 1940/11/07, 73 y.o.   MRN: [id number]  HPI Nasal congestion and cough x 2 weeks. No fever, chills or sweat.  Back aches from coughing.  No facial pain or HA.  Using delsym for a couple of days. Trid mucinex and sudafed. Some nausea.  No diarrhea. She has been having some back pain with her cough. No significant shortness of breath.  No real worsening or alleviating factors. It has kept her up a few nights.  Review of Systems     Objective:   Physical Exam  Constitutional: She is oriented to person, place, and time. She appears well-developed and well-nourished.  HENT:  Head: Normocephalic and atraumatic.  Right Ear: External ear normal.  Left Ear: External ear normal.  Nose: Nose normal.  Mouth/Throat: Oropharynx is clear and moist.  TMs and canals are clear.   Eyes: Conjunctivae and EOM are normal. Pupils are equal, round, and reactive to light.  Neck: Neck supple. No thyromegaly present.  Cardiovascular: Normal rate, regular rhythm and normal heart sounds.   Pulmonary/Chest: Effort normal. She has wheezes.  Crackles at the bases bilaterally with some slight inspiratory wheeze.  Lymphadenopathy:    She has no cervical adenopathy.  Neurological: She is alert and oriented to person, place, and time.  Skin: Skin is warm and dry. No pallor.  Psychiatric: She has a normal mood and affect. Her behavior is normal.          Assessment & Plan:  Acute sinusitis/bronchitis-because her pulse ox is down today and she is experiencing some back pain I would like to get a chest x-ray to evaluate for pneumonia. Her blood pressure is a little bit lower than usual. Her membranes do appear more so do not think she's dehydrated. She is not tachycardic. Will call for results once available. We definitely put her on antibiotic today.Work on hydration and use tylenol or IBU as needed.

## 2014-02-14 ENCOUNTER — Other Ambulatory Visit: Payer: Self-pay | Admitting: Family Medicine

## 2014-04-20 ENCOUNTER — Other Ambulatory Visit: Payer: Self-pay

## 2014-04-20 MED ORDER — SOLIFENACIN SUCCINATE 5 MG PO TABS: 5.0000 mg | ORAL_TABLET | Freq: Every day | ORAL | Status: DC

## 2014-04-23 ENCOUNTER — Other Ambulatory Visit: Payer: Self-pay

## 2014-04-23 MED ORDER — SOLIFENACIN SUCCINATE 5 MG PO TABS: 5.0000 mg | ORAL_TABLET | Freq: Every day | ORAL | Status: DC

## 2014-04-29 IMAGING — CR DG KNEE 1-2V*R*
2 series · 2 of 2 positions shown · non-contrast
Comparison: None.

CLINICAL DATA: Medial knee pain.  No known injury.

EXAM:
RIGHT KNEE - 1-2 VIEW

[view not recorded (1 of 2)]
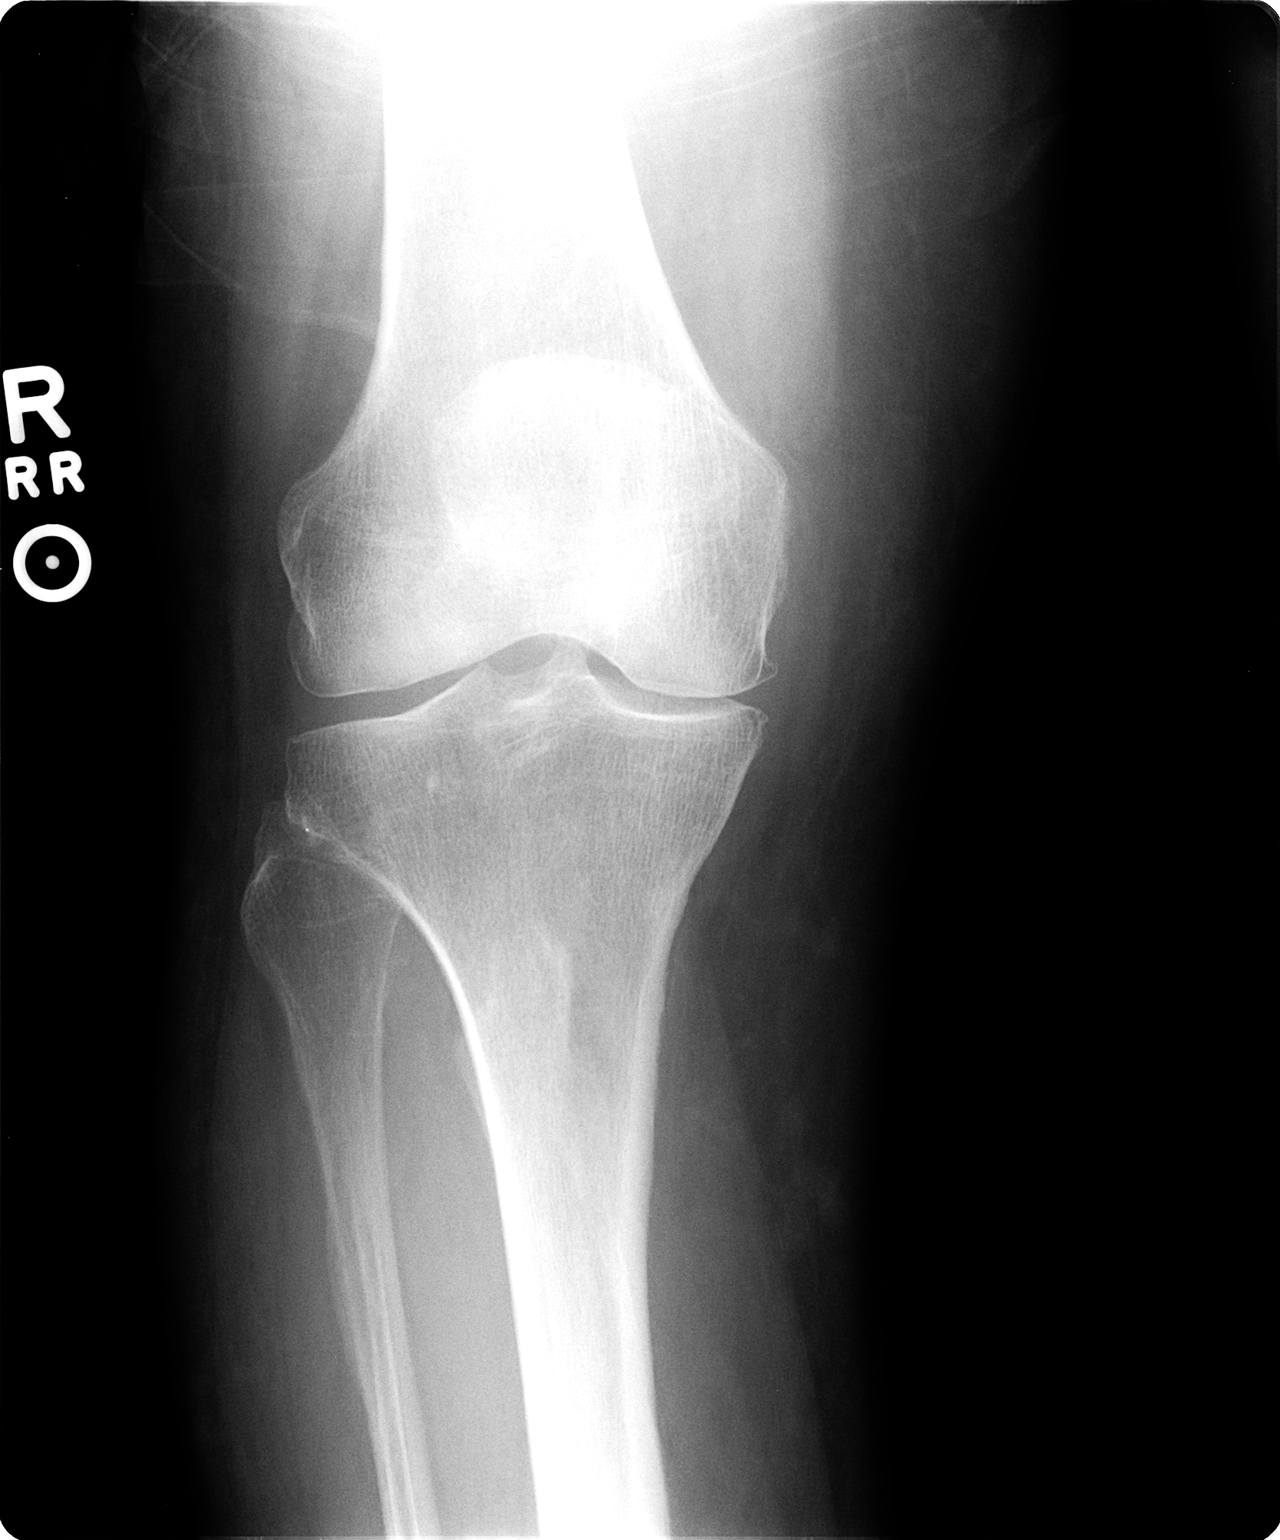

[view not recorded (2 of 2)]
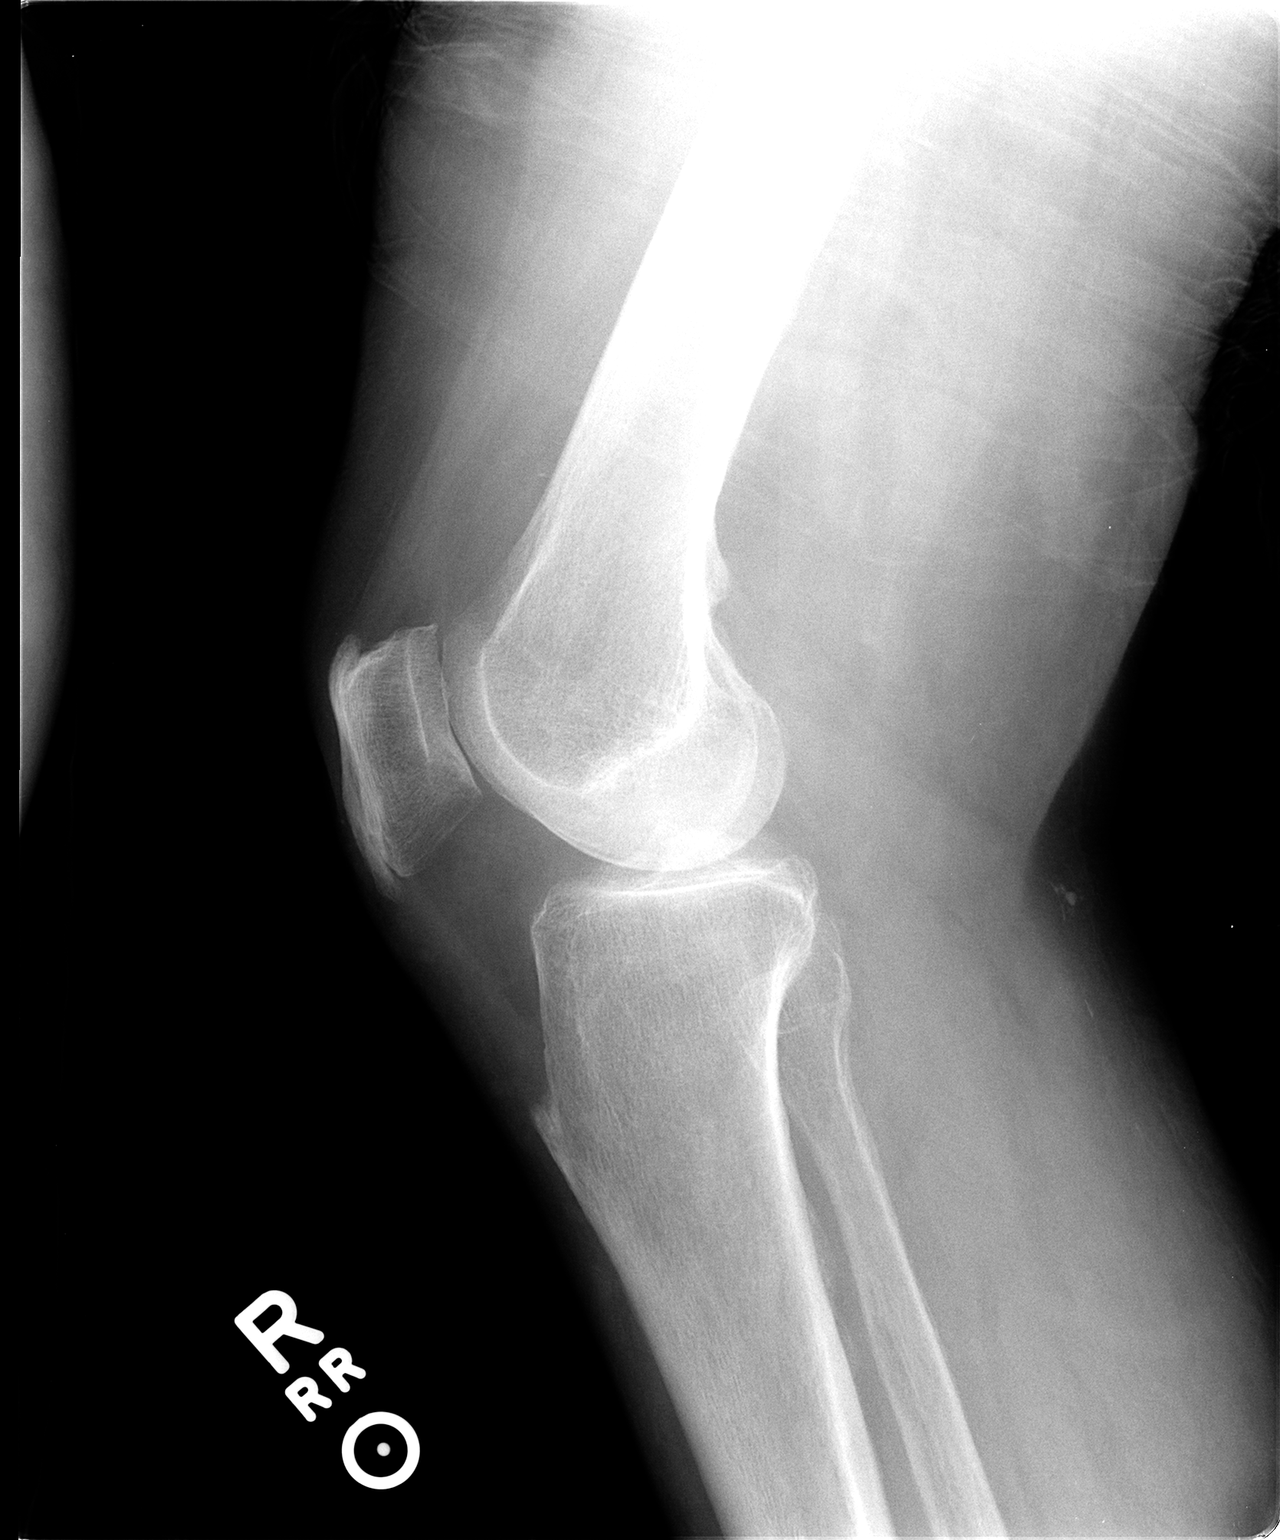

[2 of 2 positions shown; findings below may reference images not displayed]

FINDINGS: Mild to slightly moderate medial tibiofemoral joint space and mild
patellofemoral joint space degenerative changes.

No fracture or dislocation.
IMPRESSION: Mild to slightly moderate medial tibiofemoral joint space and mild
patellofemoral joint space degenerative changes.

## 2014-05-19 ENCOUNTER — Ambulatory Visit: Payer: PRIVATE HEALTH INSURANCE | Admitting: Family Medicine

## 2014-05-27 ENCOUNTER — Ambulatory Visit (INDEPENDENT_AMBULATORY_CARE_PROVIDER_SITE_OTHER): Payer: Medicare Other | Admitting: Family Medicine

## 2014-05-27 ENCOUNTER — Encounter: Payer: Self-pay | Admitting: Family Medicine

## 2014-05-27 VITALS — BP 130/72 | Ht 64.0 in | Wt 155.0 lb

## 2014-05-27 DIAGNOSIS — Z0000 Encounter for general adult medical examination without abnormal findings: Secondary | ICD-10-CM | POA: Diagnosis not present

## 2014-05-27 DIAGNOSIS — R3915 Urgency of urination: Secondary | ICD-10-CM

## 2014-05-27 DIAGNOSIS — C50919 Malignant neoplasm of unspecified site of unspecified female breast: Secondary | ICD-10-CM

## 2014-05-27 DIAGNOSIS — Z23 Encounter for immunization: Secondary | ICD-10-CM

## 2014-05-27 DIAGNOSIS — Z13220 Encounter for screening for lipoid disorders: Secondary | ICD-10-CM | POA: Diagnosis not present

## 2014-05-27 DIAGNOSIS — G20 Parkinson's disease: Secondary | ICD-10-CM

## 2014-05-27 MED ORDER — SOLIFENACIN SUCCINATE 5 MG PO TABS: 5.0000 mg | ORAL_TABLET | Freq: Every day | ORAL | Status: AC

## 2014-05-27 NOTE — Progress Notes (Signed)
Subjective:    Andrea Munoz is a 74 y.o. female who presents for Medicare Annual/Subsequent preventive examination.  Preventive Screening-Counseling & Management  Tobacco History  Smoking status  . Never Smoker   Smokeless tobacco  . Not on file     Problems Prior to Visit  Current Problems (verified) Patient Active Problem List   Diagnosis Date Noted  . 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 Urinary urgency 01/02/2013  . Urinary urgency 01/02/2013  . PARKINSON'S DISEASE 03/24/2010  . CALLUS, FOOT 12/16/2009  . Acute venous embolism and thrombosis of unspecified deep vessels of lower extremity 09/19/2009  . LEG EDEMA, BILATERAL 09/19/2009  . DYSPNEA ON EXERTION 09/19/2009  . HYPERTHYROIDISM 09/14/2009  . DEPRESSION, MILD 09/13/2009  . OSTEOPENIA 09/13/2009  . WEAKNESS 09/13/2009  . LEG PAIN, BILATERAL 01/31/2009  . NUMBNESS 01/31/2009  . UNSPECIFIED HEMORRHAGE OF GASTROINTESTINAL TRACT 03/25/2008  . HIP FRACTURE, LEFT 03/25/2008  . HIP PAIN, LEFT 02/16/2008  . BREAST MASS, BENIGN 08/12/2006  . POSTMENOPAUSAL STATUS 08/12/2006    Medications Prior to Visit Current Outpatient Prescriptions on File Prior to Visit  Medication Sig Dispense Refill  . carbidopa-levodopa (SINEMET IR) 25-100 MG per tablet Take 1 tablet by mouth 4 (four) times daily.     . clonazePAM (KLONOPIN) 0.5 MG tablet     . letrozole (FEMARA) 2.5 MG tablet Take 2.5 mg by mouth.    . solifenacin (VESICARE) 5 MG tablet Take 1 tablet (5 mg total) by mouth daily. 30 tablet 0   No current facility-administered medications on file prior to visit.    Current Medications (verified) Current Outpatient Prescriptions  Medication Sig Dispense Refill  . carbidopa-levodopa (SINEMET IR) 25-100 MG per tablet Take 1 tablet by mouth 4 (four) times daily.     . clonazePAM (KLONOPIN) 0.5 MG tablet     . letrozole (FEMARA) 2.5 MG tablet Take 2.5 mg by mouth.    . solifenacin (VESICARE) 5 MG tablet Take  1 tablet (5 mg total) by mouth daily. 30 tablet 0   No current facility-administered medications for this visit.     Allergies (verified) Hydrocodone-acetaminophen   PAST HISTORY  Family History No family history on file.  Social History History  Substance Use Topics  . Smoking status: Never Smoker   . Smokeless tobacco: Not on file  . Alcohol Use: Not on file     Are there smokers in your home (other than you)? No  Risk Factors Current exercise habits: The patient does not participate in regular exercise at present.  Dietary issues discussed: None   Cardiac risk factors: advanced age (older than 55 for men, 41 for women), obesity (BMI >= 30 kg/m2) and sedentary lifestyle.  Depression Screen (Note: if answer to either of the following is "Yes", a more complete depression screening is indicated)   Over the past two weeks, have you felt down, depressed or hopeless? No  Over the past two weeks, have you felt little interest or pleasure in doing things? No  Have you lost interest or pleasure in daily life? No  Do you often feel hopeless? No  Do you cry easily over simple problems? No  Activities of Daily Living In your present state of health, do you have any difficulty performing the following activities?:  Driving? Yes Managing money?  No Feeding yourself? No Getting from bed to chair? Yes  Climbing a flight of stairs? No Preparing food and eating?: No Bathing or showering? No Getting dressed: Yes Getting  to the toilet? No Using the toilet:No Moving around from place to place: No In the past year have you fallen or had a near fall?:No   Are you sexually active?  No  Do you have more than one partner?  No  Hearing Difficulties: No Do you often ask people to speak up or repeat themselves? No Do you experience ringing or noises in your ears? No Do you have difficulty understanding soft or whispered voices? No   Do you feel that you have a problem with memory?  Yes  Do you often misplace items? No  Do you feel safe at home?  Yes  Cognitive Testing  Alert? Yes  Normal Appearance?Yes  Oriented to person? Yes  Place? Yes   Time? Yes  Recall of three objects?  Yes  Can perform simple calculations? Yes  Displays appropriate judgment?Yes  Can read the correct time from a watch face?Yes   Advanced Directives have been discussed with the patient? Yes  List the Names of Other Physician/Practitioners you currently use: 1.    Indicate any recent Medical Services you may have received from other than [facility] providers in the past year (date may be approximate).  Immunization History  Administered Date(s) Administered  . H1N1 12/24/2007  . Influenza Split 12/13/2011  . Influenza Whole 12/13/2005, 12/02/2009  . Influenza,inj,Quad PF,36+ Mos 11/12/2013  . Influenza-Unspecified 12/15/2012  . Pneumococcal Polysaccharide-23 08/26/2006  . Td 07/14/1998, 10/02/2006  . Zoster 02/01/2007    Screening Tests Health Maintenance  Topic Date Due  . PNA vac Low Risk Adult (2 of 2 - PCV13) 08/26/2007  . COLONOSCOPY  05/27/2015 (Originally 04/11/1991)  . INFLUENZA VACCINE  09/13/2014  . MAMMOGRAM  01/16/2015  . TETANUS/TDAP  10/01/2016  . DEXA SCAN  Completed  . ZOSTAVAX  Completed    All answers were reviewed with the patient and necessary referrals were made:  METHENEY,CATHERINE, MD   05/27/2014   History reviewed: allergies, current medications, past family history, past medical history, past social history, past surgical history and problem list  Review of Systems A comprehensive review of systems was negative.    Objective:     Vision by Snellen chart: right eye:20/40, left eye:20/70, bilat 20/40.   Body mass index is 26.59 kg/(m^2). BP 130/72 mmHg  Ht 5\' 4"  (1.626 m)  Wt 155 lb (70.308 kg)  BMI 26.59 kg/m2  BP 130/72 mmHg  Ht 5\' 4"  (1.626 m)  Wt 155 lb (70.308 kg)  BMI 26.59 kg/m2 General appearance: alert, cooperative and appears  stated age Head: Normocephalic, without obvious abnormality, atraumatic Eyes: conj clear, EOMI, PEERLA Ears: normal TM's and external ear canals both ears Nose: Nares normal. Septum midline. Mucosa normal. No drainage or sinus tenderness. Throat: lips, mucosa, and tongue normal; teeth and gums normal Neck: no adenopathy, no carotid bruit, no JVD, supple, symmetrical, trachea midline and thyroid not enlarged, symmetric, no tenderness/mass/nodules Back: symmetric, no curvature. ROM normal. No CVA tenderness. Lungs: clear to auscultation bilaterally Heart: regular rate and rhythm, S1, S2 normal, no murmur, click, rub or gallop Abdomen: soft, non-tender; bowel sounds normal; no masses,  no organomegaly Extremities: extremities normal, atraumatic, no cyanosis or edema Pulses: 2+ and symmetric Skin: Skin color, texture, turgor normal. No rashes or lesions Lymph nodes: Cervical, supraclavicular, and axillary nodes normal. Neurologic: Alert and oriented X 3, normal strength and tone. Normal symmetric reflexes. Normal coordination and gait     Assessment:     Annual medicare Wellness Exam  Plan:     During the course of the visit the patient was educated and counseled about appropriate screening and preventive services including:    Pneumococcal vaccine  - Prevnar 13 given today.  Due for screening lipids.    Urinary urgency-refill her Vesicare today.  Parkinson's-follows with neurology. Currently on Sinemet.  Breast cancer-follows with oncology regularly.  Diet review for nutrition referral? Yes ____  Not Indicated _X_   Patient Instructions (the written plan) was given to the patient.  Medicare Attestation I have personally reviewed: The patient's medical and social history Their use of alcohol, tobacco or illicit drugs Their current medications and supplements The patient's functional ability including ADLs,fall risks, home safety risks, cognitive, and hearing and  visual impairment Diet and physical activities Evidence for depression or mood disorders  The patient's weight, height, BMI, and visual acuity have been recorded in the chart.  I have made referrals, counseling, and provided education to the patient based on review of the above and I have provided the patient with a written personalized care plan for preventive services.     METHENEY,CATHERINE, MD   05/27/2014
